# Patient Record
Sex: Male | Born: 1940 | Race: Black or African American | Hispanic: No | Marital: Married | Smoking: Former smoker
Health system: Southern US, Community
[De-identification: ages and names within clinical notes are randomized; demographics above are authoritative.]

## PROBLEM LIST (undated history)

## (undated) DIAGNOSIS — E7800 Pure hypercholesterolemia, unspecified: Secondary | ICD-10-CM

## (undated) DIAGNOSIS — M1990 Unspecified osteoarthritis, unspecified site: Secondary | ICD-10-CM

## (undated) DIAGNOSIS — F319 Bipolar disorder, unspecified: Secondary | ICD-10-CM

## (undated) DIAGNOSIS — F0390 Unspecified dementia without behavioral disturbance: Secondary | ICD-10-CM

## (undated) DIAGNOSIS — I10 Essential (primary) hypertension: Secondary | ICD-10-CM

## (undated) HISTORY — DX: Pure hypercholesterolemia, unspecified: E78.00

## (undated) HISTORY — PX: HERNIA REPAIR: SHX51

## (undated) HISTORY — PX: SHOULDER SURGERY: SHX246

---

## 2000-10-06 ENCOUNTER — Encounter: Payer: Self-pay | Admitting: Otolaryngology

## 2000-10-06 ENCOUNTER — Ambulatory Visit (HOSPITAL_COMMUNITY): Admission: RE | Admit: 2000-10-06 | Discharge: 2000-10-06 | Payer: Self-pay | Admitting: Otolaryngology

## 2000-10-19 ENCOUNTER — Ambulatory Visit (HOSPITAL_COMMUNITY): Admission: RE | Admit: 2000-10-19 | Discharge: 2000-10-19 | Payer: Self-pay | Admitting: Internal Medicine

## 2000-10-23 ENCOUNTER — Encounter: Payer: Self-pay | Admitting: Internal Medicine

## 2000-10-23 ENCOUNTER — Ambulatory Visit (HOSPITAL_COMMUNITY): Admission: RE | Admit: 2000-10-23 | Discharge: 2000-10-23 | Payer: Self-pay | Admitting: Internal Medicine

## 2006-12-09 ENCOUNTER — Ambulatory Visit: Payer: Self-pay | Admitting: Orthopedic Surgery

## 2007-06-17 ENCOUNTER — Emergency Department (HOSPITAL_COMMUNITY): Admission: EM | Admit: 2007-06-17 | Discharge: 2007-06-17 | Payer: Self-pay | Admitting: Emergency Medicine

## 2007-06-24 ENCOUNTER — Inpatient Hospital Stay (HOSPITAL_COMMUNITY): Admission: EM | Admit: 2007-06-24 | Discharge: 2007-06-26 | Payer: Self-pay | Admitting: Orthopedic Surgery

## 2009-05-31 ENCOUNTER — Ambulatory Visit: Payer: Self-pay | Admitting: Orthopedic Surgery

## 2009-05-31 DIAGNOSIS — M169 Osteoarthritis of hip, unspecified: Secondary | ICD-10-CM

## 2009-05-31 DIAGNOSIS — M25469 Effusion, unspecified knee: Secondary | ICD-10-CM

## 2009-05-31 DIAGNOSIS — M1610 Unilateral primary osteoarthritis, unspecified hip: Secondary | ICD-10-CM | POA: Insufficient documentation

## 2009-05-31 DIAGNOSIS — M5137 Other intervertebral disc degeneration, lumbosacral region: Secondary | ICD-10-CM

## 2009-05-31 DIAGNOSIS — M1710 Unilateral primary osteoarthritis, unspecified knee: Secondary | ICD-10-CM

## 2009-12-12 ENCOUNTER — Ambulatory Visit: Payer: Self-pay | Admitting: Gastroenterology

## 2009-12-12 ENCOUNTER — Encounter: Payer: Self-pay | Admitting: Internal Medicine

## 2009-12-12 DIAGNOSIS — R1310 Dysphagia, unspecified: Secondary | ICD-10-CM | POA: Insufficient documentation

## 2009-12-12 DIAGNOSIS — D649 Anemia, unspecified: Secondary | ICD-10-CM

## 2009-12-17 ENCOUNTER — Encounter: Payer: Self-pay | Admitting: Internal Medicine

## 2009-12-28 ENCOUNTER — Ambulatory Visit: Payer: Self-pay | Admitting: Internal Medicine

## 2009-12-28 ENCOUNTER — Ambulatory Visit (HOSPITAL_COMMUNITY): Admission: RE | Admit: 2009-12-28 | Discharge: 2009-12-28 | Payer: Self-pay | Admitting: Internal Medicine

## 2010-01-02 ENCOUNTER — Encounter: Payer: Self-pay | Admitting: Internal Medicine

## 2010-04-29 ENCOUNTER — Ambulatory Visit (HOSPITAL_COMMUNITY): Admission: RE | Admit: 2010-04-29 | Discharge: 2010-04-29 | Payer: Self-pay | Admitting: Pulmonary Disease

## 2010-07-23 NOTE — Letter (Signed)
Summary: Patient Notice, Colon Biopsy Results  [facility]  [address]   [place], [place] [address]   Phone: [phone]  Fax: [phone]       January 02, 2010   Malik Nicholson [address] [place], [place]  [address] 03-Jan-1941    Dear Mr. Mehrer,  I am pleased to inform you that the biopsies taken during your recent colonoscopy did not show any evidence of cancer upon pathologic examination. Biopsies of your stomach revealed only mild inflammation.  Additional information/recommendations:  Continue with the treatment plan as outlined on the day of your exam.  You should have a repeat colonoscopy examination  in 5 years.  Please call us if you are having persistent problems or have questions about your condition that have not been fully answered at this time.  Sincerely,    R. Roetta Sessions MD, FACP [facility] Ph: [phone]    Fax: [phone]   Appended Document: Patient Notice, Colon Biopsy Results Letter mailed to pt.  Appended Document: Patient Notice, Colon Biopsy Results reminder in computer

## 2010-07-23 NOTE — Assessment & Plan Note (Signed)
Summary: DYSPHAGIA/SS   Visit Type:  Initial Consult Referring Provider:  Juanetta Gosling Primary Care Provider:  Juanetta Gosling  Chief Complaint:  dysphagia.  History of Present Illness: Malik Nicholson is a pleasant 70 y/o male, patient of Dr. Juanetta Gosling, who presents for further evaluation of six week h/o difficulty swallowing. Especially with meats, carrots, bread. No problems with liquids. No heartburn, indigestion. Sometimes small particles of food come up. No N/V, abd pain, constipation, diarrhea, melena, brbpr, weight loss. He states last TCS was in 2002 and normal.   Patient recently started on ibuprofen 800mg  three times a day. He is not sure why, states it is for slightly low Hgb.   Labs 11/12/09; Hgb 12.6/Hct 39.6, ferritin 589, TSH 1.137, LFTs normal.  Stool hemoccult negative X 3.  Patient is interested in colonoscopy at this time. Last TCS 4/02 by Dr. Jena Gauss, poor prep on right colon.   Current Medications (verified): 1)  Lisinopril 20 Mg Tabs (Lisinopril) .... Take 1 Tablet By Mouth Once A Day 2)  Lipitor 20 Mg Tabs (Atorvastatin Calcium) .... Take 1/2 Tablet Daily 3)  Ibuprofen 800 Mg Tabs (Ibuprofen) .... Take 1 Tablet By Mouth Three Times A Day  Allergies (verified): No Known Drug Allergies  Past History:  Past Medical History: Hypertension Hypercholesterolemia Overactive bladder TCS 09/2000, Dr. Jena Gauss, poor prep right colon. BE negative.   Past Surgical History: Hernia Left shoulder hemiarthroplasty for fracture, 1/09  Family History: No FH of CRC or polyps. No liver disease.  Social History: Patient is married. Retired. Quit tob in 1999. Etoh, one to two 12 ounce daily.  Very little caffeine use.  Review of Systems General:  Denies fever, chills, sweats, anorexia, fatigue, weakness, and weight loss. Eyes:  Denies vision loss. ENT:  Complains of nasal congestion and difficulty swallowing; denies sore throat and hoarseness. CV:  Denies chest pains, angina, palpitations,  dyspnea on exertion, and peripheral edema. Resp:  Denies dyspnea at rest, dyspnea with exercise, cough, sputum, and wheezing. GI:  See HPI. GU:  Denies urinary burning and blood in urine. MS:  Denies joint pain / LOM. Derm:  Denies rash and itching. Neuro:  Denies weakness, frequent headaches, memory loss, and confusion. Psych:  Denies depression and anxiety. Endo:  Denies unusual weight change. Heme:  Denies bruising and bleeding. Allergy:  Denies hives and rash.  Vital Signs:  Patient profile:   70 year old male Height:      71 inches Weight:      216 pounds BMI:     30.23 Temp:     98.8 degrees F oral Pulse rate:   76 / minute BP sitting:   140 / 84  (left arm) Cuff size:   large  Vitals Entered By: Cloria Spring LPN (December 12, 2009 1:25 PM)  Physical Exam  General:  Well developed, well nourished, no acute distress. Head:  Normocephalic and atraumatic. Eyes:  Conjunctivae pink, no scleral icterus.  Mouth:  Oropharyngeal mucosa moist, pink.  No lesions, erythema or exudate.    Neck:  Supple; no masses or thyromegaly. Lungs:  Clear throughout to auscultation. Heart:  Regular rate and rhythm; no murmurs, rubs,  or bruits. Abdomen:  Bowel sounds normal.  Abdomen is soft, nontender, nondistended.  No rebound or guarding.  No hepatosplenomegaly, masses or hernias.  No abdominal bruits.  Extremities:  No clubbing, cyanosis, edema or deformities noted. Neurologic:  Alert and  oriented x4;  grossly normal neurologically. Skin:  Intact without significant lesions or rashes. Cervical Nodes:  No significant cervical adenopathy. Psych:  Alert and cooperative. Normal mood and affect.  Impression & Recommendations:  Problem # 1:  DYSPHAGIA UNSPECIFIED (ICD-787.20)  Six week h/o dysphagia to solid foods. Denies heartburn/indigestion. DDx includes stricture, ring, malignancy, diverticulum. Recommend further evaluation. EGD/ED to be performed in near future.  Risks, alternatives,  benefits including but not limited to risk of reaction to medications, bleeding, infection, and perforation addressed.  Patient voiced understanding and verbal consent obtained.   Orders: Consultation Level III ([address])  Problem # 2:  ANEMIA, NORMOCYTIC (ICD-285.9)  Very mild normocytic anemia. Patient's last TCS 9 years ago and poor prep on right colon. Proceed with TCS now with modified prep. Colonoscopy to be performed in near future.  Risks, alternatives, and benefits including but not limited to the risk of reaction to medication, bleeding, infection, and perforation were addressed.  Patient voiced understanding and provided verbal consent.   Orders: Consultation Level III ([address]) I would like to thank Dr. Juanetta Gosling for allowing [place] to take part in the care of this nice patient.

## 2010-07-23 NOTE — Op Note (Signed)
Summary: Operative Report  Operative Report   Imported By: Rosine Beat 12/17/2009 12:05:43  _____________________________________________________________________  External Attachment:    Type:   Image     Comment:   External Document

## 2010-07-23 NOTE — Letter (Signed)
Summary: referral from dr. Kari Baars  referral from dr. Kari Baars   Imported By: Rosine Beat 12/17/2009 12:09:29  _____________________________________________________________________  External Attachment:    Type:   Image     Comment:   External Document

## 2010-07-23 NOTE — Letter (Signed)
Summary: Internal Other /procedure instructions  Internal Other /procedure instructions   Imported By: Cloria Spring LPN 25/95/6387 56:43:32  _____________________________________________________________________  External Attachment:    Type:   Image     Comment:   External Document

## 2010-08-23 ENCOUNTER — Encounter: Payer: Self-pay | Admitting: Urgent Care

## 2010-08-26 ENCOUNTER — Encounter (INDEPENDENT_AMBULATORY_CARE_PROVIDER_SITE_OTHER): Payer: Self-pay | Admitting: *Deleted

## 2010-09-03 NOTE — Medication Information (Signed)
Summary: PROTONIX 40MG   PROTONIX 40MG    Imported By: Rexene Alberts 08/23/2010 10:34:24  _____________________________________________________________________  External Attachment:    Type:   Image     Comment:   External Document  Appended Document: PROTONIX 40MG     Prescriptions: PROTONIX 40 MG TBEC (PANTOPRAZOLE SODIUM) 1 by mouth daly for acid reflux  #31 x 11   Entered and Authorized by:   Joselyn Arrow FNP-BC   Signed by:   Joselyn Arrow FNP-BC on 08/26/2010   Method used:   Printed then faxed to ...       [facility] [address] (retail)       [address]       [place], [place]  [address]  [place]       Ph: [phone]       Fax: [phone]   RxID:   [id number]  Please fax to pharm   Appended Document: PROTONIX 40MG  Faxed.

## 2010-09-03 NOTE — Medication Information (Signed)
Summary: RX Folder  RX Folder   Imported By: Peggyann Shoals 08/26/2010 12:48:24  _____________________________________________________________________  External Attachment:    Type:   Image     Comment:   External Document

## 2010-11-05 NOTE — Op Note (Signed)
Malik Nicholson, Malik Nicholson              ACCOUNT NO.:  [id number]   MEDICAL RECORD NO.:  [id number]          PATIENT TYPE:  INP   LOCATION:  5128                         FACILITY:  [facility]   PHYSICIAN:  Malik Nicholson. Malik Nicholson, M.D. DATE OF BIRTH:  03/26/1941   DATE OF PROCEDURE:  06/24/2007  DATE OF DISCHARGE:                               OPERATIVE REPORT   PREOPERATIVE DIAGNOSIS:  Displaced left proximal humerus fracture.   POSTOPERATIVE DIAGNOSIS:  Displaced left proximal humerus fracture.   PROCEDURE PERFORMED:  Left shoulder hemiarthroplasty using DePuy [employer]  FX system.   ATTENDING SURGEON:  Malik Nicholson. Malik Nicholson, M.D.   ASSISTANT:  Standley Dakins, PA-C.   ANESTHESIA:  General anesthesia was used.   ESTIMATED BLOOD LOSS:  200 mL.   FLUID REPLACEMENT:  2000 mL crystalloid.   COUNTS:  Instrument count was correct.   COMPLICATIONS:  None.   ANTIBIOTICS:  No antibiotics given.   INDICATIONS:  The patient is a 70 year old male who suffered a displaced  proximal humerus fracture around Christmas time.  The patient presented  initially to [facility] Emergency Department, was managed in a sling  initially and was eventually referred down to [facility] on 06/22/08.  The patient presented to orthopedics as an outpatient with a displaced  proximal humerus fracture with a dislocated humeral head component and a  significant comminution in the tuberosities.  The patient now presents  for operative treatment to restore proximal humeral anatomy on the left  shoulder and to restore function and eliminate pain.  Informed consent  was obtained.   DESCRIPTION OF PROCEDURE:  After an adequate level of anesthesia  achieved, the patient was positioned in the modified beach chair  position.  All neurovascular structures were padded appropriately.  The  left shoulder was sterilely prepped and draped in the usual manner.  We  entered the shoulder through a deltopectoral incision.  Dissection was  carried  sharply down through subcutaneous tissues, deltopectoral  interval identified, cephalic vein taken laterally to the deltoid.  We  went ahead and identified the fractured humerus.  Copious amounts of  hematoma were removed.  We thoroughly irrigated that area, identified  the subscapularis and lesser tuberosity, and went ahead and cleaned off  all the organizing hematoma present around that area, freed up the  subscapularis from the coracoid process and also from the capsule, such  that it was fully mobilized and we debrided the lesser tuberosity down  to a manageable size that it would fit nicely back into the  reconstruction of the tuberosities as a part of the humeral prosthesis.   We next placed two #2 FiberWire sutures in mattress fashion with the  suture strands coming out over the top of the bone to the lesser  tuberosity.  This was one nice fragment for the lesser.  The fracture  humeral head was found posteriorly with the cancellous bone portion  facing into the deltoid.  There was actually some damage to the deltoid  on the undersurface of it.  The bone seemed to be digging into that  deltoid.  In addition, the humeral shaft  was digging in there as well.  We did remove hat bone and used to morcellized that for grafting at the  end.  The greater tuberosity was not one single piece of bone but was  probably 20 pieces of small bone and unfortunately not a good large  piece of bone for fixation.  We placed #2 FiberWire suture in modified W  stitch, a total of 3 of them with 6 strands of suture which will be  coming out over the top of what is left of the bony portion of the  greater tuberosity.  We did mobilize this off the posterior glenoid and  scapular neck and also off the underside of the deltoid.  We released  the biceps tendon performing its tenotomy and then we marked the biceps  groove, went ahead and prepared the humeral shaft by reaming it up to  size 12 and then placing a  12 stem with the anterior fin adjacent to the  biceps groove.  We also had a 52 head x 18 and we placed that trial on  the stem and adjusted the height appropriately.  We felt that it would  be four laser marks at the biceps groove.   At this point, we placed three drill holes in the anterior humeral shaft  to place a total of two #2 FiberWire sutures in a mattress fashion with  four strands of suture coming out anteriorly to provide shaft to  tuberosity fixation.  We then vacuum mixed the DePuy #1 cement, went  ahead and let it get into a doughy state and then hand packed the cement  down the humeral shaft and then placed the stem in down to the fourth  laser mark with the anterior fin adjacent to the biceps groove.  Once it  fully hardened, we had around the [place] stitch through the medial fin of  the prosthesis.  That stitch came out medial to the lesser tuberosity  and lateral to again what was left of the greater tuberosity.  We then  bone graft it extensively, the anterior humerus, the exposed portion of  the prosthesis, and then brought the tuberosity down on top of that bone  and sutured into themselves with the mattress sutures.   Next, we tied our around the world stitch.  We also took our sutures  from the shaft and brought those up and gained excellent purchase in the  tendon of the rotator cuff and at the subscapularis and gaining  excellent relative stability to the tuberosities and rotator cuff.  We  are going to have to go quite a bit slower because we did not have a  solid greater tuberosity piece, but we did have what I felt to be a very  minimal relative motion in the reconstruction as I rotated the shoulder  in and out.  We thoroughly irrigated and then closed the deltopectoral  interval with 0 Vicryl suture followed by 2-0 Vicryl subcutaneous  closure and 4-0 Monocryl for skin.  Steri-Strips were applied followed  by a sterile dressing.  The patient tolerated surgery  well.      Malik Nicholson. Malik Nicholson, M.D.  Electronically Signed     SRN/MEDQ  D:  06/24/2007  T:  06/24/2007  Job:  [id number]

## 2010-11-05 NOTE — Discharge Summary (Signed)
Malik Nicholson, Malik Nicholson              ACCOUNT NO.:  [id number]   MEDICAL RECORD NO.:  [id number]          PATIENT TYPE:  INP   LOCATION:  5128                         FACILITY:  [facility]   PHYSICIAN:  Almedia Balls. Ranell Patrick, M.D. DATE OF BIRTH:  Jul 07, 1940   DATE OF ADMISSION:  06/24/2007  DATE OF DISCHARGE:  06/26/2007                               DISCHARGE SUMMARY   ADMISSION DIAGNOSIS:  Left proximal humerus fracture.   DISCHARGE DIAGNOSIS:  Left proximal humerus fracture.   BRIEF HISTORY:  The patient is a 70 year old male who states he fell off  a ladder on the 24th/25th of December.  The patient was seen in our  office on the 30th of December diagnosed with left proximal humerus  fracture.  The patient elected to have an ORIF versus hemiarthroplasty  of the left shoulder.   PROCEDURE:  The patient had a left shoulder hemiarthroplasty by Dr.  Malon Kindle using a DePuy global effect system on June 24, 2007.  Assistant was [employer]. PA-C.  General anesthesia was used.  Estimated blood loss 200 mL.  Fluid replacement 2000 mL.  No  complications.   HOSPITAL COURSE:  The patient admitted on June 24, 2007 where he  underwear the above-stated procedure, which he tolerated well.  After  adequate time in the post anesthesia care unit, was transferred up to  5000.  Postop day #1, the patient states he had some moderate pain right  after surgery but currently is resting comfortably.  Has only some  minimal discomfort to the left arm.  The incision showed some mild  bleeding but was under control.  Neurovascularly he is intact distally  and capillary fill was less than 2 seconds.  Postop day 2, he had a  dressing change.  No sign of cellulitis, erythema or infection.  Kept in  a sling.  The patient was also placed on an Ativan protocol for EtOH  withdrawal.  The patient was able to perform some general physical  therapy with that left arm. Neurovascularly he was intact distally.   DISCHARGE/PLAN:  The patient being discharged home on June 26, 2007.  The patient has no known drug allergies.   DISCHARGE MEDICATIONS:  1. Percocet 5 mg 1-2 tablets q. 4-6 hours p.r.n. pain.  2. Robaxin 500 mg p.o. q.6 hours p.r.n.  3. Also Lipitor  4. And Prinivil unknown dosages.   DIET:  Is regular.   CONDITION:  Is stable.   FOLLOW-UP:  Patient will follow back up back up with Dr. Malon Kindle  in 2 weeks.      Thomas B. Durwin Nora, P.A.      Almedia Balls. Ranell Patrick, M.D.  Electronically Signed    TBD/MEDQ  D:  06/25/2007  T:  06/25/2007  Job:  [id number]

## 2010-11-05 NOTE — H&P (Signed)
NAMEJALANI, Malik Nicholson NO.:  [id number]   MEDICAL RECORD NO.:  [id number]          PATIENT TYPE:  EMS   LOCATION:  ED                            FACILITY:  [facility]   PHYSICIAN:  Almedia Balls. Ranell Patrick, M.D. DATE OF BIRTH:  August 13, 1940   DATE OF ADMISSION:  06/17/2007  DATE OF DISCHARGE:  06/17/2007                              HISTORY & PHYSICAL   CHIEF COMPLAINT:  Left shoulder pain.   HISTORY OF PRESENT ILLNESS:  Patient is a 70 year old healthy male who  fell off a ladder on June 17, 2007, injuring his left shoulder.  Patient was seen at the [facility] office and referred to  Dr. Ranell Patrick for a proximal humerus fracture.  Patient denied any other  injuries, loss of consciousness, or injuries to the right upper  extremity, bilateral lower extremities, or pelvis.   MEDICAL HISTORY:  1. Hypertension.  2. Hyperlipidemia.   FAMILY HISTORY:  Coronary artery disease.   CURRENT MEDICATIONS:  Lipitor, Prinvil, and unknown pain medicine.   DRUG ALLERGIES:  None.   SOCIAL HISTORY:  Patient of Dr. Juanetta Gosling in [place].  Does not smoke.  Uses occasional alcohol.  Is left-handed and is retired.   PHYSICAL EXAMINATION:  Patient is a 70 year old healthy-appearing male.  No acute distress.  Pleasant mood and affect.  Alert and oriented x3.  Height is 5 foot 10.  Weight is 215.  Respirations are 18 here in the  office today.  Cervical spine shows no tenderness to palpation in the paraspinal  muscles or spinous process.  He has full range of motion with no  difficulty.  Cranial nerves II-XII are grossly intact.  Examination of  the left shoulder shows moderate edema.  It is extending all the way  from his left shoulder down to his left hand.  Range of motion is  limited due to known fracture.  Neurovascularly, he is intact otherwise.  Examination of the right upper extremity, bilateral lower extremities  show full range of motion.  Sensation is grossly intact.  No  gross  deformity.  Patient does have active breath sounds and is breathing  normally.   X-ray shows a severely comminuted and displaced left proximal humerus  fracture.   IMPRESSION:  Severely comminuted displaced left proximal humerus  fracture.   PLAN:  Left shoulder hemiarthroplasty by Dr. Malon Kindle.      Thomas B. Durwin Nora, P.A.      Almedia Balls. Ranell Patrick, M.D.  Electronically Signed    TBD/MEDQ  D:  06/22/2007  T:  06/22/2007  Job:  [id number]

## 2011-03-13 LAB — BASIC METABOLIC PANEL
BUN: 11
CO2: 26
CO2: 29
Chloride: 96
Chloride: 96
Creatinine, Ser: 0.82
Creatinine, Ser: 0.97
Glucose, Bld: 122 — ABNORMAL HIGH
Potassium: 4.6
Sodium: 130 — ABNORMAL LOW

## 2011-03-13 LAB — CBC
HCT: 28.6 — ABNORMAL LOW
HCT: 29.7 — ABNORMAL LOW
Hemoglobin: 10.2 — ABNORMAL LOW
Hemoglobin: 11.4 — ABNORMAL LOW
MCV: 89.2
Platelets: 339
Platelets: 385
RBC: 3.33 — ABNORMAL LOW
RBC: 3.72 — ABNORMAL LOW
RDW: 14.7
RDW: 14.7
WBC: 11.7 — ABNORMAL HIGH
WBC: 9.1

## 2011-03-13 LAB — APTT: aPTT: 32

## 2011-03-13 LAB — PROTIME-INR: INR: 1

## 2011-03-13 LAB — DIFFERENTIAL
Basophils Absolute: 0
Basophils Relative: 0
Lymphocytes Relative: 14
Monocytes Absolute: 1.1 — ABNORMAL HIGH
Neutro Abs: 7.5
Neutrophils Relative %: 73

## 2011-07-30 ENCOUNTER — Ambulatory Visit (INDEPENDENT_AMBULATORY_CARE_PROVIDER_SITE_OTHER): Payer: Medicare Other | Admitting: Orthopedic Surgery

## 2011-07-30 ENCOUNTER — Encounter: Payer: Self-pay | Admitting: Orthopedic Surgery

## 2011-07-30 VITALS — BP 140/80 | Ht 73.0 in | Wt 198.0 lb

## 2011-07-30 DIAGNOSIS — M169 Osteoarthritis of hip, unspecified: Secondary | ICD-10-CM | POA: Insufficient documentation

## 2011-07-30 DIAGNOSIS — IMO0002 Reserved for concepts with insufficient information to code with codable children: Secondary | ICD-10-CM | POA: Insufficient documentation

## 2011-07-30 DIAGNOSIS — G5790 Unspecified mononeuropathy of unspecified lower limb: Secondary | ICD-10-CM

## 2011-07-30 DIAGNOSIS — M5137 Other intervertebral disc degeneration, lumbosacral region: Secondary | ICD-10-CM

## 2011-07-30 MED ORDER — GABAPENTIN 100 MG PO CAPS: 100.0000 mg | ORAL_CAPSULE | Freq: Three times a day (TID) | ORAL | Status: DC

## 2011-07-30 NOTE — Progress Notes (Signed)
X-ray reports  [facility] pelvis  LEFT hip pain  There is degenerative arthritis in both hips LEFT greater than RIGHT there are some mild osteophytes and joint space narrowing primarily centrally  Impression osteoarthritis LEFT greater than RIGHT both hips mild    X-ray lumbar spine  3 views  Radicular-like symptoms LEFT leg  Degenerative scoliosis is seen in the lumbar spine with multiple osteophytes consistent with spinal stenosis like changes  Impression spinal stenosis with degenerative disc disease and scoliosis

## 2011-07-30 NOTE — Progress Notes (Signed)
  Subjective:    Malik Nicholson is a 71 y.o. male who presents for evaluation of low back pain. The patient has had previous osteoarthritis of lumbar spine. Symptoms have been present for 3 years and are gradually worsening.  Onset was related to / precipitated by no known injury. The pain is located in the left sacroiliac area, left gluteal area or radiating to left leg(s) and radiates to the left lower leg. The pain is described as throbbing and occurs at bedtime. He rates his pain as a 6 on a scale of 0-10. Symptoms are exacerbated by nothing in particular. Symptoms are improved by nothing. He has also tried Neurontin and steroid dose pack back in December of 2010 with good result which provided no symptom relief. He has tingling in the left leg associated with the back pain. The patient has no "red flag" history indicative of complicated back pain.  The following portions of the patient's history were reviewed and updated as appropriate: allergies, current medications, past family history, past medical history, past social history, past surgical history and problem list.  Review of Systems Respiratory: positive for wheezing Hematologic/lymphatic: positive for seasonal ALLERGIES    Objective:   Inspection and palpation: scoliosis noted lumbar, spinal tenderness noted LEFT, paraspinal tenderness noted LEFT, sacroiliac tenderness noted LEFT. Muscle tone and ROM exam: muscle tone normal without spasm, full range of motion without pain. Straight leg raise: positive at 40 degrees on the left. Neurological: normal DTRs, muscle strength and reflexes. The range of motion in his hips is notable for 115 of hip flexion bilaterally with obligatory external rotation related to the hip arthritis.  There is decreased internal rotation bilaterally with painful internal rotation LEFT hip only. 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  Degenerative arthritis is seen in both LEFT and RIGHT hip LEFT worse than RIGHT and there is degenerative scoliotic disease and spinal stenosis lumbar spine.   Plan:    I would like to have him go for lidocaine steroid injection test to see how much pain relief he would get from that which would help [place] plan whether or not he needs hip replacement vs. continued followup and treatment related to his lower back disease which would include evaluation with MRI after some physical therapy.

## 2011-07-30 NOTE — Patient Instructions (Signed)
You will be scheduled for a lidocaine injection test

## 2011-08-01 ENCOUNTER — Other Ambulatory Visit: Payer: Self-pay | Admitting: *Deleted

## 2011-08-01 DIAGNOSIS — M25552 Pain in left hip: Secondary | ICD-10-CM

## 2011-08-04 ENCOUNTER — Telehealth: Payer: Self-pay | Admitting: Radiology

## 2011-08-04 NOTE — Telephone Encounter (Signed)
I called to give the patient his appointment at [facility] on 08-07-11 at 12:45 for his left hip arthrogram. Patient will follow up back here at the office for his results.

## 2011-08-07 ENCOUNTER — Ambulatory Visit (HOSPITAL_COMMUNITY)
Admission: RE | Admit: 2011-08-07 | Discharge: 2011-08-07 | Disposition: A | Discharge status: Home / Self Care | Payer: Medicare Other | Source: Ambulatory Visit | Attending: Orthopedic Surgery | Admitting: Orthopedic Surgery

## 2011-08-07 DIAGNOSIS — M25559 Pain in unspecified hip: Secondary | ICD-10-CM | POA: Insufficient documentation

## 2011-08-07 DIAGNOSIS — M25552 Pain in left hip: Secondary | ICD-10-CM

## 2011-08-07 MED ORDER — METHYLPREDNISOLONE ACETATE 80 MG/ML IJ SUSP
120.0000 mg | Freq: Once | INTRAMUSCULAR | Status: AC
  Administered 2011-08-07: 120 mg via INTRA_ARTICULAR
  Filled 2011-08-07: qty 1.5

## 2011-08-07 MED ORDER — IOHEXOL 300 MG/ML  SOLN
10.0000 mL | Freq: Once | INTRAMUSCULAR | Status: AC | PRN
  Administered 2011-08-07: 10 mL via INTRA_ARTICULAR

## 2011-08-07 MED ORDER — BUPIVACAINE HCL 0.5 % IJ SOLN
10.0000 mL | Freq: Once | INTRAMUSCULAR | Status: DC
  Administered 2011-08-07: 10 mL via INTRA_ARTICULAR
  Filled 2011-08-07: qty 10

## 2011-08-07 NOTE — Procedures (Signed)
PreOperative Dx: LEFT hip pain Postoperative Dx: LEFT hip pain Procedure:   Fluoro guided therapeutic LEFT hip injection Radiologist:  Tyron Russell Anesthesia:  3 ml of 2% lidocaine Specimen:  None EBL:   None Complications: None Comment:    LEFT hip injected with 120 mg Depo-Medrol and 8.5 ml of 0.5% bupivocaine

## 2011-08-07 NOTE — Progress Notes (Signed)
Sensorcaine 0.5%       8.68mL injected Depomedrol 120mg      1.23mL injected

## 2011-08-20 ENCOUNTER — Ambulatory Visit (INDEPENDENT_AMBULATORY_CARE_PROVIDER_SITE_OTHER): Payer: Medicare Other | Admitting: Orthopedic Surgery

## 2011-08-20 ENCOUNTER — Encounter: Payer: Self-pay | Admitting: Orthopedic Surgery

## 2011-08-20 VITALS — BP 126/66 | Ht 73.0 in | Wt 198.0 lb

## 2011-08-20 DIAGNOSIS — M169 Osteoarthritis of hip, unspecified: Secondary | ICD-10-CM

## 2011-08-20 DIAGNOSIS — M5137 Other intervertebral disc degeneration, lumbosacral region: Secondary | ICD-10-CM

## 2011-08-20 DIAGNOSIS — M51379 Other intervertebral disc degeneration, lumbosacral region without mention of lumbar back pain or lower extremity pain: Secondary | ICD-10-CM | POA: Insufficient documentation

## 2011-08-20 NOTE — Progress Notes (Signed)
Patient ID: Malik Nicholson, male   DOB: [birth year], 71 y.o.   MRN: [id number] Chief Complaint  Patient presents with  . Follow-up    3 week recheck Left hip following lidocaine injection     He has a degenerative disc condition in his lower back and has osteoarthritis of his LEFT hip. His symptoms were mixed, so we sent him for lidocaine injection test in his hip and knee basically improved about 85%. He no longer has to take the Neurontin that we put him on as well. He does have some residual pain, up near his groin. He says he can live with at this time.  Continue Neurontin as needed. Come back in July for x-rays.

## 2011-08-20 NOTE — Patient Instructions (Signed)
Introductions were given to continue medication as needed. Call us sooner than July if symptoms worsen.

## 2011-11-25 ENCOUNTER — Ambulatory Visit (INDEPENDENT_AMBULATORY_CARE_PROVIDER_SITE_OTHER): Payer: Medicare Other | Admitting: Orthopedic Surgery

## 2011-11-25 ENCOUNTER — Encounter: Payer: Self-pay | Admitting: Orthopedic Surgery

## 2011-11-25 ENCOUNTER — Ambulatory Visit (INDEPENDENT_AMBULATORY_CARE_PROVIDER_SITE_OTHER): Payer: Medicare Other

## 2011-11-25 VITALS — BP 104/60 | Ht 71.0 in | Wt 203.0 lb

## 2011-11-25 DIAGNOSIS — M1710 Unilateral primary osteoarthritis, unspecified knee: Secondary | ICD-10-CM

## 2011-11-25 DIAGNOSIS — M25569 Pain in unspecified knee: Secondary | ICD-10-CM

## 2011-11-25 NOTE — Progress Notes (Signed)
Subjective:     Patient ID: Malik Nicholson, male   DOB: 02/18/1941, 71 y.o.   MRN: [id number] Chief Complaint  Patient presents with  . 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 The pain has become fairly severe and is interfering with his activities of daily living and overall function  Past Medical History  Diagnosis Date  . High blood pressure     Past Surgical History  Procedure Date  . Shoulder surgery   . Hernia repair    family history includes Cancer in an unspecified family member; Diabetes in an unspecified family member; and Lung disease in an unspecified family member.   Review of Systems As noted in history    Objective:   Physical Exam BP 104/60  Ht 5\' 11"  (1.803 m)  Wt 203 lb (92.08 kg)  BMI 28.31 kg/m2 General appearance is normal Orientation x3 normal Mood and affect normal Abnormal gait pattern favoring his left leg inability to bear weight and inability to fully extend the knee Evaluation of left knee shows the knee is in valgus alignment with painless range of motion past 90 up to 130, he is tender over the medial joint line and medial femoral condyle. His collateral ligaments are stable his condition ligaments are stable. There is normal muscle tone in the knee the skin is normal without rash. There is no edema in the limb the pulse and temperature  are normal. There is no lymphadenopathy in the extremity. Gross sensation is normal. Pathologic reflexes negative. Coordination normal balance hard to assess secondary inability to weight-bear      Assessment:    The x-ray evaluation of his knee show diffuse and equal joint space narrowing medial and lateral compartment. The patella centered. Osteoarthritis with no effusion.  Difficult to assess atraumatic onset of knee pain other than exacerbation of arthritis. He is also considering hip joint replacement surgery however, we need to get his knee pain under control prior to that. He also has spinal stenosis symptoms as he is using a grocery cart to make it through the grocery store or he will become severely fatigued and cannot walk.  As noted above we stated that he was given an intra-articular injection of cortisone which helped his left hip pain indicating hip as the primary source of pain however his elevation of his spinal stenosis symptoms causes and the question as well. He's also having knee symptoms x-ray showing end-stage arthritis in the knee so this case is quite complicated. We discussed this.  We gave him an injection in the left knee    Plan:     Inject left knee  Come back in 2 weeks reassess left knee discussed left hip surgery and discuss spinal stenosis symptoms.  Knee  Injection Procedure Note  Pre-operative Diagnosis: left knee oa  Post-operative Diagnosis: same  Indications: pain  Anesthesia:  ethyl chloride   Procedure Details   Verbal consent was obtained for the procedure. Time out was completed.The joint was prepped with alcohol, followed by  Ethyl chloride spray and A 20 gauge needle was inserted into the knee via lateral approach; 4ml 1% lidocaine and 1 ml of depomedrol  was then injected into the joint . The needle was removed and the area cleansed and dressed.  Complications:  None; patient tolerated the procedure well.

## 2011-11-25 NOTE — Patient Instructions (Signed)
You have received a steroid shot. 15% of patients experience increased pain at the injection site with in the next 24 hours. This is best treated with ice and tylenol extra strength 2 tabs every 8 hours. If you are still having pain please call the office.   Rest next 2 weeks limit your activity

## 2011-12-10 ENCOUNTER — Encounter: Payer: Self-pay | Admitting: Orthopedic Surgery

## 2011-12-10 ENCOUNTER — Ambulatory Visit (INDEPENDENT_AMBULATORY_CARE_PROVIDER_SITE_OTHER): Payer: Medicare Other | Admitting: Orthopedic Surgery

## 2011-12-10 VITALS — BP 130/78 | Ht 71.0 in | Wt 201.0 lb

## 2011-12-10 DIAGNOSIS — M179 Osteoarthritis of knee, unspecified: Secondary | ICD-10-CM

## 2011-12-10 DIAGNOSIS — M169 Osteoarthritis of hip, unspecified: Secondary | ICD-10-CM

## 2011-12-10 DIAGNOSIS — M1610 Unilateral primary osteoarthritis, unspecified hip: Secondary | ICD-10-CM

## 2011-12-10 DIAGNOSIS — M4800 Spinal stenosis, site unspecified: Secondary | ICD-10-CM

## 2011-12-10 DIAGNOSIS — IMO0002 Reserved for concepts with insufficient information to code with codable children: Secondary | ICD-10-CM

## 2011-12-10 DIAGNOSIS — M1710 Unilateral primary osteoarthritis, unspecified knee: Secondary | ICD-10-CM

## 2011-12-10 NOTE — Patient Instructions (Addendum)
We will schedule your MRI and call you with the date and time

## 2011-12-10 NOTE — Progress Notes (Signed)
Patient ID: Malik Nicholson, male   DOB: 1941-03-06, 71 y.o.   MRN: [id number] Chief Complaint  Patient presents with  . Follow-up    2 week follow up left knee and left hip    BP 130/78  Ht 5\' 11"  (1.803 m)  Wt 91.173 kg (201 lb)  BMI 28.03 kg/m2  Problem #1 LEFT knee pain status post injection with improvement.  Problem #2 LEFT hip arthritis with continued pain we'll need hip replacement.  Problem #3 pain over the LEFT iliac crest, history of spinal stenosis on x-ray. Recommend MRI to evaluate LEFT lower extremity radicular pain.  Previous treatment includes activity modification, physical therapy, oral anti-inflammatories, pain medication. The treatment. Lasted more than 6 weeks.  Review of Systems  Musculoskeletal: Positive for back pain and joint pain.  All other systems reviewed and are negative.   Right Hip Exam  Right hip exam is normal.   Range of Motion  The patient has normal right hip ROM.  Muscle Strength  The patient has normal right hip strength.  Tests  FABER: negative   Left Hip Exam   Tenderness  The patient is experiencing tenderness in the lateral.  Range of Motion  Flexion:  90 abnormal  Internal Rotation: 5 abnormal  External Rotation:  20 abnormal  Abduction: 20  Adduction: 15   Muscle Strength  The patient has normal left hip strength.   Other  Erythema: absent Scars: absent Sensation: normal Pulse: present  Comments:  He has a flexed spinal posture and a limp  The lumbar soine is tender as well at L5-S1       Schedule for MRI and then we will schedule her for hip replacement surgery

## 2011-12-18 ENCOUNTER — Ambulatory Visit (HOSPITAL_COMMUNITY)
Admission: RE | Admit: 2011-12-18 | Discharge: 2011-12-18 | Disposition: A | Discharge status: Home / Self Care | Payer: Medicare Other | Source: Ambulatory Visit | Attending: Orthopedic Surgery | Admitting: Orthopedic Surgery

## 2011-12-18 DIAGNOSIS — M545 Low back pain, unspecified: Secondary | ICD-10-CM | POA: Insufficient documentation

## 2011-12-18 DIAGNOSIS — M5126 Other intervertebral disc displacement, lumbar region: Secondary | ICD-10-CM | POA: Insufficient documentation

## 2011-12-18 DIAGNOSIS — M4800 Spinal stenosis, site unspecified: Secondary | ICD-10-CM

## 2011-12-18 DIAGNOSIS — M48061 Spinal stenosis, lumbar region without neurogenic claudication: Secondary | ICD-10-CM | POA: Insufficient documentation

## 2011-12-23 ENCOUNTER — Telehealth: Payer: Self-pay | Admitting: Radiology

## 2011-12-23 NOTE — Telephone Encounter (Signed)
Patient has Malik Nicholson, authorization # [id number] and it expires 01-29-12 for MRI of L-spine at [facility].

## 2011-12-24 ENCOUNTER — Ambulatory Visit (INDEPENDENT_AMBULATORY_CARE_PROVIDER_SITE_OTHER): Payer: Medicare Other

## 2011-12-24 ENCOUNTER — Ambulatory Visit (INDEPENDENT_AMBULATORY_CARE_PROVIDER_SITE_OTHER): Payer: Medicare Other | Admitting: Orthopedic Surgery

## 2011-12-24 ENCOUNTER — Encounter: Payer: Self-pay | Admitting: Orthopedic Surgery

## 2011-12-24 VITALS — Ht 71.0 in | Wt 201.0 lb

## 2011-12-24 DIAGNOSIS — M25559 Pain in unspecified hip: Secondary | ICD-10-CM

## 2011-12-24 DIAGNOSIS — M25552 Pain in left hip: Secondary | ICD-10-CM

## 2011-12-24 NOTE — Progress Notes (Signed)
Patient ID: Malik Nicholson, male   DOB: [birth year], 71 y.o.   MRN: [id number] Chief Complaint  Patient presents with  . Follow-up    Recheck with MRI results of L-spine.    Ht 5\' 11"  (1.803 m)  Wt 201 lb (91.173 kg)  BMI 28.03 kg/m2  This is a post imaging followup visit  Diagnosis spinal stenosis   Image MRI   Reading   *RADIOLOGY REPORT*  Clinical Data: Spinal stenosis. Back pain  MRI LUMBAR SPINE WITHOUT CONTRAST  Technique: Multiplanar and multiecho pulse sequences of the lumbar  spine were obtained without intravenous contrast.  Comparison: None.  Findings: The patient is had severe back pain and was not able to  hold still. Image quality degraded by motion. The study was  terminated prior to completion by the patient. Axial T1 images  were not obtained.  Mild anterior slip at L3-4. Negative for fracture or mass. Conus  medullaris is normal.  L1-2: Negative  L2-3: Mild disc and mild facet degeneration.  L3-4: 3 mm anterior slip. Disc bulging and bilateral facet  hypertrophy. Mild spinal stenosis.  L4-5: Diffuse bulging of the disc. Bilateral facet degeneration.  Mild right foraminal narrowing is present.  L5-S1: Mild disc degeneration and disc bulging.  IMPRESSION:  Image quality degraded by motion.  Mild anterior slip of L3-4 with mild spinal stenosis.  Disc bulging and mild right foraminal narrowing at L4-5.  Original Report Authenticated By: Camelia Phenes, M.D.       I have viewed the image and the report, I agree with the report   The plan will be TO PROCEED WITH LEFT THA   NOTE: heavy alcohol use

## 2011-12-24 NOTE — Patient Instructions (Addendum)
You have been scheduled for total hip arthroplasty please stop all blood thinners one week prior to surgery  The risks and benefits of the procedure include but are not limited to:  wound infection, postoperative bleeding, DEEP  vein thrombosis or blood clot. Pulmonary embolus which can be fatal. Dislocation of the hip. Pain in the hip. Early hip failure due to excessive wear. The average time for recovery is 3-5 months.   Total Hip Replacement In total hip replacement, the damaged hip is replaced with an artificial hip joint (prosthesis). The purpose of this surgery is to reduce pain and improve your range of motion. It is one of the most successful joint replacement surgeries. LET YOUR CAREGIVER KNOW ABOUT:    Allergies.   Medicines taken, including herbs, eyedrops, over-the-counter medicines, and creams.   Use of steroids (by mouth or creams).   Previous problems with anesthetics.   Family history of anesthetic problems.   Possibility of pregnancy, if this applies.   History of blood clots (thrombophlebitis).   History of bleeding or blood problems.   Previous surgery.   Other health problems.  BEFORE THE PROCEDURE    Do not eat or drink anything for as long as directed by your caregiver before surgery.   You should be present 60 minutes before your procedure or as directed by your caregiver.  PROCEDURE An intravenous (IV) line for giving fluids will be started. You will be given medicines and gas to make you sleep (anesthetic), or you will be given medicines through a needle in your back to make you numb from the waist down. Your surgeon will take out any damaged cartilage and bone. He or she will then put in new metal, plastic, or ceramic joint surfaces to restore alignment and function to your hip. AFTER THE PROCEDURE   After the procedure, you will be taken to the recovery area where a nurse will watch and check your progress. You may have a long, narrow tube (catheter) in  your bladder after surgery. The catheter helps you empty your bladder (pass your urine). Once you are awake, stable, and taking fluids well, you will be returned to your room. You will receive physical therapy until you are doing well and your caregiver feels it is safe for you to go home. If you do not have help at home, you may need to go to an extended care facility for 5 to 14 days after the procedure. Document Released: 09/15/2000 Document Revised: 05/29/2011 Document Reviewed: 04/11/2009 [facility] Patient Information 2012 [place], [place].   Total Hip Replacement Care After Please read the instructions outlined below. Refer to these instructions for the next few weeks. These discharge instructions provide you with general information on caring for yourself after surgery. Your caregiver may also give you more detailed instructions. If you have any problems or questions after discharge, please call your caregiver. HOME CARE INSTRUCTIONS    It will be normal to be sore for a couple weeks following surgery. See your caregiver if this seems to be getting worse rather than better.   Only take over-the-counter or prescription medicines for pain, discomfort, or fever as directed by your caregiver.   Use showers for bathing, until seen or as instructed.   Change dressings if necessary or as directed.   You may resume normal diet and activities as directed or allowed.   Avoid lifting until you are instructed otherwise.   Make an appointment to see your caregiver for stitch or staple removal  when instructed.   You may use ice on your incision for 15 to 20 minutes 3 to 4 times per day for the first two days.   Use a raised toilet seat and avoid sitting in low chairs as instructed.   Use crutches or a walker as instructed.  SEEK MEDICAL CARE IF:  There is redness, swelling, or increasing pain or tenderness in the wound.   There is pus or unusual drainage coming from the wound.   There is a  foul smell coming from the wound or dressing.   The wound breaks open (the edges do not stay together) after sutures or staples have been removed.   You have any other questions or concerns.  SEEK IMMEDIATE MEDICAL CARE IF:    You have a fever.   You develop swelling of your calf or leg or there is shortness of breath, difficulty breathing, or chest pain.   You develop a rash.   You develop any reaction or side effects to medications given.   You develop increasing pain in your hip.  MAKE SURE YOU:  Understand these instructions.   Will watch your condition.   Will get help right away if you are not doing well or get worse.  Document Released: 12/27/2004 Document Revised: 05/29/2011 Document Reviewed: 04/11/2009 [facility] Patient Information 2012 [place], [place].

## 2011-12-29 ENCOUNTER — Encounter (HOSPITAL_COMMUNITY): Payer: Self-pay | Admitting: Pharmacy Technician

## 2012-01-02 ENCOUNTER — Telehealth: Payer: Self-pay | Admitting: Orthopedic Surgery

## 2012-01-02 NOTE — Telephone Encounter (Addendum)
Contacted insurer, [employer], ph# [phone] regarding pre-authorization for in-patient/admit for 02/02/12 surgery at [facility], CPT [phone], ICD( 715.95, 715.15.  Transferred to phone# [phone], [employer]; spoke with national intake coordinator, Mercy B, who gave Ref# [id number].  States a nurse will contact our office regarding any clinicals needed. *Follow up by 01/16/12 if no contact from the insurance.

## 2012-01-05 NOTE — Telephone Encounter (Signed)
Received call back from [employer], Tawni Pummel, prior authorization department;  He requests fax of patient's clinicals to his attention, to fax # [phone].  States to include notes, Xray reports, therapies, and any other reports, by 01/13/12, along with the ref# [id number].  Done.

## 2012-01-13 ENCOUNTER — Other Ambulatory Visit: Payer: Self-pay | Admitting: *Deleted

## 2012-01-13 DIAGNOSIS — Z96649 Presence of unspecified artificial hip joint: Secondary | ICD-10-CM

## 2012-01-16 NOTE — Telephone Encounter (Addendum)
Called back to [employer] - reached at ph# [phone].  Per Estell Harpin, CPT [phone] has been approved per nurse review, as of 01/06/12, for admit date 02/02/12 to [facility].  # of days to be approved upon patient's admission to hospital.  Same Notification/Pre-cert Ref# [id number].

## 2012-01-21 ENCOUNTER — Ambulatory Visit: Payer: Medicare Other | Admitting: Orthopedic Surgery

## 2012-01-27 ENCOUNTER — Encounter (HOSPITAL_COMMUNITY): Payer: Medicare Other

## 2012-01-29 ENCOUNTER — Other Ambulatory Visit: Payer: Self-pay

## 2012-01-29 ENCOUNTER — Other Ambulatory Visit: Payer: Self-pay | Admitting: *Deleted

## 2012-01-29 ENCOUNTER — Encounter (HOSPITAL_COMMUNITY)
Admission: RE | Admit: 2012-01-29 | Discharge: 2012-01-29 | Disposition: A | Discharge status: Home / Self Care | Payer: Medicare Other | Source: Ambulatory Visit | Attending: Orthopedic Surgery | Admitting: Orthopedic Surgery

## 2012-01-29 ENCOUNTER — Encounter (HOSPITAL_COMMUNITY): Payer: Self-pay | Admitting: Pharmacy Technician

## 2012-01-29 ENCOUNTER — Encounter (HOSPITAL_COMMUNITY): Payer: Self-pay

## 2012-01-29 LAB — HEMOGLOBIN AND HEMATOCRIT, BLOOD: HCT: 39.7 % (ref 39.0–52.0)

## 2012-01-29 LAB — PROTIME-INR
INR: 0.99 (ref 0.00–1.49)
Prothrombin Time: 13.3 seconds (ref 11.6–15.2)

## 2012-01-29 LAB — BASIC METABOLIC PANEL
CO2: 25 mEq/L (ref 19–32)
Calcium: 9.6 mg/dL (ref 8.4–10.5)
Chloride: 104 mEq/L (ref 96–112)
Glucose, Bld: 110 mg/dL — ABNORMAL HIGH (ref 70–99)
Sodium: 137 mEq/L (ref 135–145)

## 2012-01-29 LAB — APTT: aPTT: 28 seconds (ref 24–37)

## 2012-01-29 LAB — SURGICAL PCR SCREEN
MRSA, PCR: NEGATIVE
Staphylococcus aureus: NEGATIVE

## 2012-01-29 MED ORDER — CHLORHEXIDINE GLUCONATE 4 % EX LIQD: 60.0000 mL | Freq: Once | CUTANEOUS | Status: DC

## 2012-01-29 NOTE — Patient Instructions (Addendum)
20 MARTELL MCFADYEN  01/29/2012   Your procedure is scheduled on:  02/08/12  Report to [facility] at 06:15 AM.  Call this number if you have problems the morning of surgery: [phone]   Remember:   Do not eat or drink:After Midnight.  Take these medicines the morning of surgery with A SIP OF WATER: Lisinopril and Gabapentin   Do not wear jewelry, make-up or nail polish.  Do not wear lotions, powders, or perfumes. You may wear deodorant.  Do not shave 48 hours prior to surgery. Men may shave face and neck.  Do not bring valuables to the hospital.  Contacts, dentures or bridgework may not be worn into surgery.  Leave suitcase in the car. After surgery it may be brought to your room.  For patients admitted to the hospital, checkout time is 11:00 AM the day of discharge.   Patients discharged the day of surgery will not be allowed to drive home.  Special Instructions: CHG Shower Use Special Wash: 1/2 bottle night before surgery and 1/2 bottle morning of surgery.   Please read over the following fact sheets that you were given: Pain Booklet, MRSA Information, Surgical Site Infection Prevention, Anesthesia Post-op Instructions and Care and Recovery After Surgery    Total Hip Replacement In total hip replacement, the damaged hip is replaced with an artificial hip joint (prosthesis). The purpose of this surgery is to reduce pain and improve your range of motion. It is one of the most successful joint replacement surgeries. LET YOUR CAREGIVER KNOW ABOUT:   Allergies.   Medicines taken, including herbs, eyedrops, over-the-counter medicines, and creams.   Use of steroids (by mouth or creams).   Previous problems with anesthetics.   Family history of anesthetic problems.   Possibility of pregnancy, if this applies.   History of blood clots (thrombophlebitis).   History of bleeding or blood problems.   Previous surgery.   Other health problems.  BEFORE THE PROCEDURE   Do not eat or  drink anything for as long as directed by your caregiver before surgery.   You should be present 60 minutes before your procedure or as directed by your caregiver.  PROCEDURE An intravenous (IV) line for giving fluids will be started. You will be given medicines and gas to make you sleep (anesthetic), or you will be given medicines through a needle in your back to make you numb from the waist down. Your surgeon will take out any damaged cartilage and bone. He or she will then put in new metal, plastic, or ceramic joint surfaces to restore alignment and function to your hip. AFTER THE PROCEDURE  After the procedure, you will be taken to the recovery area where a nurse will watch and check your progress. You may have a long, narrow tube (catheter) in your bladder after surgery. The catheter helps you empty your bladder (pass your urine). Once you are awake, stable, and taking fluids well, you will be returned to your room. You will receive physical therapy until you are doing well and your caregiver feels it is safe for you to go home. If you do not have help at home, you may need to go to an extended care facility for 5 to 14 days after the procedure. Document Released: 09/15/2000 Document Revised: 05/29/2011 Document Reviewed: 04/11/2009 [facility] Patient Information 2012 [place], [place].   PATIENT INSTRUCTIONS POST-ANESTHESIA  IMMEDIATELY FOLLOWING SURGERY:  Do not drive or operate machinery for the first twenty four hours after surgery.  Do not make any important decisions for twenty four hours after surgery or while taking narcotic pain medications or sedatives.  If you develop intractable nausea and vomiting or a severe headache please notify your doctor immediately.  FOLLOW-UP:  Please make an appointment with your surgeon as instructed. You do not need to follow up with anesthesia unless specifically instructed to do so.  WOUND CARE INSTRUCTIONS (if applicable):  Keep a dry clean dressing on  the anesthesia/puncture wound site if there is drainage.  Once the wound has quit draining you may leave it open to air.  Generally you should leave the bandage intact for twenty four hours unless there is drainage.  If the epidural site drains for more than 36-48 hours please call the anesthesia department.  QUESTIONS?:  Please feel free to call your physician or the hospital operator if you have any questions, and they will be happy to assist you.

## 2012-01-29 NOTE — Progress Notes (Signed)
01/29/12 1133  OBSTRUCTIVE SLEEP APNEA  Have you ever been diagnosed with sleep apnea through a sleep study? No  Do you snore loudly (loud enough to be heard through closed doors)?  1  Do you often feel tired, fatigued, or sleepy during the daytime? 0  Has anyone observed you stop breathing during your sleep? 0  Do you have, or are you being treated for high blood pressure? 1  BMI more than 35 kg/m2? 0  Age over 72 years old? 1  Neck circumference greater than 40 cm/18 inches? 0  Gender: 1  Obstructive Sleep Apnea Score 4   Score 4 or greater  Results sent to PCP

## 2012-01-30 NOTE — OR Nursing (Signed)
Pt. Notified that surgery is Mon  02/02/2012 , not the 18th.  Knows he is the 1st case Monday and to arrive at 6:15

## 2012-02-01 NOTE — H&P (Signed)
Malik Nicholson is an 71 y.o. male.   Chief Complaint: left hip pain  HPI: Malik Nicholson is a 71 yo male with a 5 year history of pain in his left hip. He has DDD of the L-spine as well and has been treated with oral nsaids, oral analgesics as well as steroids and hip joint injection - intially joint injection relieved his hip pain. He had a pre-op MRI of the L-spine which showed non compressive DDD.   He has sucummbed to disabling hip pain in the groin lateral gluteal region as well as the anterior thigh. He came in with a relative who is a nurse who told me he has a history of heavy drinking and is a high risk for DT'S.  As far as the hip goes he has severe pain and can not do the things he wants to do such bend from the waist , sit for long periods of time or just be comfortable in his activities of daily living. I have reviewed the risks and benefits of the surgery with him and his wife and relative. 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     Past Medical History  Diagnosis Date  . High blood pressure   . Sleep apnea     stop bangcock score of 4    Past Surgical History  Procedure Date  . Shoulder surgery   . Hernia repair     Family History  Problem Relation Age of Onset  . Cancer    . Lung disease    . Diabetes     Social History:  reports that he has never smoked. He does not have any smokeless tobacco history on file. He reports that he drinks about 4.2 ounces of alcohol per week. He reports that he does not use illicit drugs.  Allergies:  Allergies  Allergen Reactions  . Lisinopril Swelling    No prescriptions prior to admission   No current facility-administered medications on file prior to encounter.   Current Outpatient Prescriptions on File Prior to Encounter  Medication Sig Dispense Refill  . atorvastatin (LIPITOR) 10 MG tablet Take 10 mg by mouth daily.      Marland Kitchen gabapentin (NEURONTIN)  100 MG capsule Take 1 capsule (100 mg total) by mouth 3 (three) times daily.  90 capsule  2  . losartan (COZAAR) 50 MG tablet Take 50 mg by mouth daily.        No results found for this or any previous visit (from the past 48 hour(s)). No results found.  Review of Systems  Constitutional: Negative for fever, chills and weight loss.  Eyes: Negative for photophobia and discharge.  Respiratory: Negative for cough.   Cardiovascular: Negative for chest pain.  Gastrointestinal: Negative for abdominal pain.  Genitourinary: Negative for dysuria and flank pain.  Musculoskeletal: Positive for back pain and joint pain.  Skin: Negative for rash.  Neurological: Positive for tingling and sensory change. Negative for dizziness, tremors, speech change, focal weakness, loss of consciousness and headaches.  Endo/Heme/Allergies: Negative for polydipsia.  Psychiatric/Behavioral: Negative for depression, suicidal ideas and substance abuse.    There were no vitals taken for this visit. Physical Exam  Constitutional: He is oriented to person, place, and time. He appears well-developed and well-nourished.       Normal body habitus   HENT:  Head: Normocephalic.  Eyes: Pupils are equal, round, and reactive to light.  Neck: Normal range of motion. No JVD present.  Cardiovascular: Normal rate and intact distal pulses.   Respiratory: Effort normal.  GI: He exhibits no distension.  Musculoskeletal:       Right shoulder: Normal.       Left shoulder: Normal.       Right elbow: Normal.      Left elbow: Normal.       Right wrist: Normal.       Left wrist: Normal.       Right hip: He exhibits decreased range of motion. He exhibits normal strength, no tenderness, no bony tenderness, no swelling, no crepitus, no deformity and no laceration.       Left hip: He exhibits decreased range of motion, tenderness, bony tenderness and crepitus. He exhibits normal strength, no swelling, no deformity and no laceration.        Right knee: He exhibits decreased range of motion, deformity, abnormal alignment and bony tenderness. He exhibits no swelling, no effusion, no ecchymosis, no laceration, no erythema, no LCL laxity, normal patellar mobility, normal meniscus and no MCL laxity. tenderness found. Medial joint line, lateral joint line and LCL tenderness noted. No MCL and no patellar tendon tenderness noted.       Left knee: He exhibits decreased range of motion, deformity, abnormal alignment and bony tenderness. He exhibits no swelling, no effusion, no ecchymosis, no laceration, no erythema, no LCL laxity, normal patellar mobility, normal meniscus and no MCL laxity. tenderness found. Medial joint line and lateral joint line tenderness noted. No MCL, no LCL and no patellar tendon tenderness noted.       Right ankle: Normal.       Lumbar back: He exhibits decreased range of motion, tenderness, bony tenderness and pain. He exhibits no swelling, no edema, no deformity, no laceration and no spasm.       Right upper leg: Normal.       Left upper leg: Normal.       Right lower leg: Normal.       Left lower leg: Normal.       Right foot: Normal.       Left foot: Normal.  Lymphadenopathy:    He has no cervical adenopathy.  Neurological: He is alert and oriented to person, place, and time. He has normal reflexes. He displays normal reflexes. No cranial nerve deficit. He exhibits normal muscle tone. Coordination normal.  Skin: Skin is warm and dry. No erythema.  Psychiatric: He has a normal mood and affect. His behavior is normal. Judgment and thought content normal.     Assessment/Plan BMET    Component Value Date/Time   NA 137 01/29/2012 1124   K 4.6 01/29/2012 1124   CL 104 01/29/2012 1124   CO2 25 01/29/2012 1124   GLUCOSE 110* 01/29/2012 1124   BUN 19 01/29/2012 1124   CREATININE 1.11 01/29/2012 1124   CALCIUM 9.6 01/29/2012 1124   GFRNONAA 64* 01/29/2012 1124   GFRAA 75* 01/29/2012 1124    CBC    Component Value Date/Time    WBC 11.7* 06/26/2007 0500   RBC 3.33* 06/26/2007 0500   HGB 13.0 01/29/2012 1124   HCT 39.7 01/29/2012 1124   PLT 385 06/26/2007 0500   MCV 89.2 06/26/2007 0500   MCHC 34.3 06/26/2007 0500   RDW 14.5 06/26/2007 0500   LYMPHSABS 1.4 06/24/2007 0950   MONOABS 1.1* 06/24/2007 0950   EOSABS 0.3 06/24/2007 0950   BASOSABS 0.0 06/24/2007 0950    The  x-rays are in the system: they show joint space narrowing, sclerotic bone, osteophytes.  I want to re-examine his leg length in the OR while he is supine   DX: OA LEFT HIP  PLAN LEFT THA    Fuller Canada 02/01/2012, 7:05 PM

## 2012-02-02 ENCOUNTER — Encounter (HOSPITAL_COMMUNITY)
Admission: RE | Disposition: A | Discharge status: Home Health | Payer: Self-pay | Source: Ambulatory Visit | Attending: Orthopedic Surgery

## 2012-02-02 ENCOUNTER — Inpatient Hospital Stay (HOSPITAL_COMMUNITY): Payer: Medicare Other | Admitting: Anesthesiology

## 2012-02-02 ENCOUNTER — Encounter (HOSPITAL_COMMUNITY): Payer: Self-pay | Admitting: *Deleted

## 2012-02-02 ENCOUNTER — Inpatient Hospital Stay (HOSPITAL_COMMUNITY)
Admission: RE | Admit: 2012-02-02 | Discharge: 2012-02-05 | DRG: 470 | Disposition: A | Discharge status: Home Health | Payer: Medicare Other | Source: Ambulatory Visit | Attending: Orthopedic Surgery | Admitting: Orthopedic Surgery

## 2012-02-02 ENCOUNTER — Encounter (HOSPITAL_COMMUNITY): Payer: Self-pay | Admitting: Anesthesiology

## 2012-02-02 ENCOUNTER — Inpatient Hospital Stay (HOSPITAL_COMMUNITY): Payer: Medicare Other

## 2012-02-02 DIAGNOSIS — M5137 Other intervertebral disc degeneration, lumbosacral region: Secondary | ICD-10-CM | POA: Diagnosis present

## 2012-02-02 DIAGNOSIS — M1610 Unilateral primary osteoarthritis, unspecified hip: Principal | ICD-10-CM | POA: Diagnosis present

## 2012-02-02 DIAGNOSIS — M169 Osteoarthritis of hip, unspecified: Secondary | ICD-10-CM

## 2012-02-02 DIAGNOSIS — G4730 Sleep apnea, unspecified: Secondary | ICD-10-CM | POA: Diagnosis present

## 2012-02-02 DIAGNOSIS — M51379 Other intervertebral disc degeneration, lumbosacral region without mention of lumbar back pain or lower extremity pain: Secondary | ICD-10-CM | POA: Diagnosis present

## 2012-02-02 DIAGNOSIS — G5790 Unspecified mononeuropathy of unspecified lower limb: Secondary | ICD-10-CM

## 2012-02-02 DIAGNOSIS — I10 Essential (primary) hypertension: Secondary | ICD-10-CM | POA: Diagnosis present

## 2012-02-02 HISTORY — PX: TOTAL HIP ARTHROPLASTY: SHX124

## 2012-02-02 SURGERY — ARTHROPLASTY, HIP, TOTAL,POSTERIOR APPROACH
Anesthesia: Spinal | Site: Hip | Laterality: Left | Wound class: Clean

## 2012-02-02 MED ORDER — BUPIVACAINE HCL (PF) 0.25 % IJ SOLN
INTRAMUSCULAR | Status: AC
  Filled 2012-02-02: qty 270

## 2012-02-02 MED ORDER — SODIUM CHLORIDE 0.9 % IR SOLN
Status: DC | PRN
  Administered 2012-02-02 (×2): 3000 mL

## 2012-02-02 MED ORDER — ACETAMINOPHEN 10 MG/ML IV SOLN
1000.0000 mg | Freq: Once | INTRAVENOUS | Status: DC
  Administered 2012-02-02: 1000 mg via INTRAVENOUS

## 2012-02-02 MED ORDER — POTASSIUM CHLORIDE IN NACL 20-0.9 MEQ/L-% IV SOLN
INTRAVENOUS | Status: DC
  Administered 2012-02-02 – 2012-02-03 (×3): via INTRAVENOUS

## 2012-02-02 MED ORDER — CELECOXIB 100 MG PO CAPS
400.0000 mg | ORAL_CAPSULE | Freq: Once | ORAL | Status: AC
  Administered 2012-02-02: 400 mg via ORAL

## 2012-02-02 MED ORDER — LOSARTAN POTASSIUM 50 MG PO TABS
50.0000 mg | ORAL_TABLET | Freq: Every day | ORAL | Status: DC
  Administered 2012-02-02 – 2012-02-04 (×3): 50 mg via ORAL
  Filled 2012-02-02 (×3): qty 1

## 2012-02-02 MED ORDER — METOCLOPRAMIDE HCL 5 MG/ML IJ SOLN: 5.0000 mg | Freq: Three times a day (TID) | INTRAMUSCULAR | Status: DC | PRN

## 2012-02-02 MED ORDER — CEFAZOLIN SODIUM 1-5 GM-% IV SOLN
INTRAVENOUS | Status: AC
  Filled 2012-02-02: qty 50

## 2012-02-02 MED ORDER — SENNOSIDES-DOCUSATE SODIUM 8.6-50 MG PO TABS: 1.0000 | ORAL_TABLET | Freq: Every evening | ORAL | Status: DC | PRN

## 2012-02-02 MED ORDER — VITAMIN B-1 100 MG PO TABS
100.0000 mg | ORAL_TABLET | Freq: Every day | ORAL | Status: DC
  Administered 2012-02-02 – 2012-02-04 (×3): 100 mg via ORAL
  Filled 2012-02-02 (×3): qty 1

## 2012-02-02 MED ORDER — OXYCODONE HCL 5 MG PO TABS
5.0000 mg | ORAL_TABLET | ORAL | Status: DC
  Administered 2012-02-02: 5 mg via ORAL
  Administered 2012-02-02 – 2012-02-03 (×2): 10 mg via ORAL
  Administered 2012-02-03 (×3): 5 mg via ORAL
  Administered 2012-02-03: 10 mg via ORAL
  Administered 2012-02-04: 5 mg via ORAL
  Filled 2012-02-02 (×2): qty 1
  Filled 2012-02-02: qty 2
  Filled 2012-02-02 (×3): qty 1
  Filled 2012-02-02 (×2): qty 2
  Filled 2012-02-02: qty 1

## 2012-02-02 MED ORDER — LORAZEPAM 2 MG/ML IJ SOLN: 1.0000 mg | Freq: Four times a day (QID) | INTRAMUSCULAR | Status: DC | PRN

## 2012-02-02 MED ORDER — ONDANSETRON HCL 4 MG PO TABS: 4.0000 mg | ORAL_TABLET | Freq: Four times a day (QID) | ORAL | Status: DC | PRN

## 2012-02-02 MED ORDER — PHENOL 1.4 % MT LIQD: 1.0000 | OROMUCOSAL | Status: DC | PRN

## 2012-02-02 MED ORDER — OXYCODONE HCL 5 MG PO TABS
ORAL_TABLET | ORAL | Status: AC
  Filled 2012-02-02: qty 1

## 2012-02-02 MED ORDER — CEFAZOLIN SODIUM-DEXTROSE 2-3 GM-% IV SOLR
INTRAVENOUS | Status: AC
  Filled 2012-02-02: qty 50

## 2012-02-02 MED ORDER — SENNA 8.6 MG PO TABS
1.0000 | ORAL_TABLET | Freq: Two times a day (BID) | ORAL | Status: DC
  Administered 2012-02-02 – 2012-02-05 (×5): 8.6 mg via ORAL
  Filled 2012-02-02 (×5): qty 1

## 2012-02-02 MED ORDER — METHOCARBAMOL 100 MG/ML IJ SOLN
500.0000 mg | Freq: Four times a day (QID) | INTRAVENOUS | Status: DC | PRN
  Filled 2012-02-02: qty 5

## 2012-02-02 MED ORDER — MIDAZOLAM HCL 2 MG/2ML IJ SOLN
INTRAMUSCULAR | Status: AC
  Filled 2012-02-02: qty 2

## 2012-02-02 MED ORDER — CEFAZOLIN SODIUM-DEXTROSE 2-3 GM-% IV SOLR
2.0000 g | Freq: Four times a day (QID) | INTRAVENOUS | Status: AC
  Administered 2012-02-02 (×2): 2 g via INTRAVENOUS
  Filled 2012-02-02 (×2): qty 50

## 2012-02-02 MED ORDER — DEXTROSE 5 % IV SOLN: 3.0000 g | INTRAVENOUS | Status: DC

## 2012-02-02 MED ORDER — FENTANYL CITRATE 0.05 MG/ML IJ SOLN
INTRAMUSCULAR | Status: DC | PRN
  Administered 2012-02-02: 25 ug via INTRAVENOUS
  Administered 2012-02-02: 50 ug via INTRAVENOUS
  Administered 2012-02-02: 25 ug via INTRATHECAL

## 2012-02-02 MED ORDER — METOCLOPRAMIDE HCL 10 MG PO TABS: 5.0000 mg | ORAL_TABLET | Freq: Three times a day (TID) | ORAL | Status: DC | PRN

## 2012-02-02 MED ORDER — FLEET ENEMA 7-19 GM/118ML RE ENEM: 1.0000 | ENEMA | Freq: Once | RECTAL | Status: AC | PRN

## 2012-02-02 MED ORDER — FENTANYL CITRATE 0.05 MG/ML IJ SOLN
INTRAMUSCULAR | Status: AC
  Filled 2012-02-02: qty 2

## 2012-02-02 MED ORDER — EPHEDRINE SULFATE 50 MG/ML IJ SOLN
INTRAMUSCULAR | Status: AC
  Filled 2012-02-02: qty 1

## 2012-02-02 MED ORDER — DIPHENHYDRAMINE HCL 12.5 MG/5ML PO ELIX: 12.5000 mg | ORAL_SOLUTION | ORAL | Status: DC | PRN

## 2012-02-02 MED ORDER — LORAZEPAM 1 MG PO TABS
0.0000 mg | ORAL_TABLET | Freq: Four times a day (QID) | ORAL | Status: AC
  Administered 2012-02-02: 1 mg via ORAL
  Filled 2012-02-02: qty 1

## 2012-02-02 MED ORDER — PROPOFOL 10 MG/ML IV EMUL
INTRAVENOUS | Status: DC | PRN
  Administered 2012-02-02: 09:00:00 via INTRAVENOUS
  Administered 2012-02-02: 25 ug/kg/min via INTRAVENOUS

## 2012-02-02 MED ORDER — BUPIVACAINE IN DEXTROSE 0.75-8.25 % IT SOLN
INTRATHECAL | Status: DC | PRN
  Administered 2012-02-02: 15 mg via INTRATHECAL

## 2012-02-02 MED ORDER — BUPIVACAINE-EPINEPHRINE PF 0.5-1:200000 % IJ SOLN
INTRAMUSCULAR | Status: DC | PRN
  Administered 2012-02-02: 60 mL

## 2012-02-02 MED ORDER — CELECOXIB 100 MG PO CAPS
200.0000 mg | ORAL_CAPSULE | Freq: Two times a day (BID) | ORAL | Status: DC
  Administered 2012-02-02 – 2012-02-05 (×6): 200 mg via ORAL
  Filled 2012-02-02 (×6): qty 2

## 2012-02-02 MED ORDER — GABAPENTIN 100 MG PO CAPS
100.0000 mg | ORAL_CAPSULE | Freq: Three times a day (TID) | ORAL | Status: DC
  Administered 2012-02-02 – 2012-02-05 (×8): 100 mg via ORAL
  Filled 2012-02-02 (×8): qty 1

## 2012-02-02 MED ORDER — ONDANSETRON HCL 4 MG/2ML IJ SOLN
INTRAMUSCULAR | Status: AC
  Filled 2012-02-02: qty 2

## 2012-02-02 MED ORDER — ALUM & MAG HYDROXIDE-SIMETH 200-200-20 MG/5ML PO SUSP: 30.0000 mL | ORAL | Status: DC | PRN

## 2012-02-02 MED ORDER — FENTANYL CITRATE 0.05 MG/ML IJ SOLN: 25.0000 ug | INTRAMUSCULAR | Status: DC | PRN

## 2012-02-02 MED ORDER — LACTATED RINGERS IV SOLN
INTRAVENOUS | Status: DC
  Administered 2012-02-02: 09:00:00 via INTRAVENOUS
  Administered 2012-02-02: 1000 mL via INTRAVENOUS

## 2012-02-02 MED ORDER — BUPIVACAINE-EPINEPHRINE PF 0.5-1:200000 % IJ SOLN
INTRAMUSCULAR | Status: AC
  Filled 2012-02-02: qty 10

## 2012-02-02 MED ORDER — ACETAMINOPHEN 10 MG/ML IV SOLN
1000.0000 mg | Freq: Four times a day (QID) | INTRAVENOUS | Status: AC
  Administered 2012-02-03 (×2): 1000 mg via INTRAVENOUS
  Filled 2012-02-02 (×3): qty 100

## 2012-02-02 MED ORDER — OXYCODONE HCL 5 MG PO TABS
5.0000 mg | ORAL_TABLET | Freq: Once | ORAL | Status: AC
  Administered 2012-02-02: 5 mg via ORAL

## 2012-02-02 MED ORDER — METHOCARBAMOL 100 MG/ML IJ SOLN
500.0000 mg | Freq: Once | INTRAVENOUS | Status: AC
  Administered 2012-02-02: 500 mg via INTRAVENOUS
  Filled 2012-02-02: qty 5

## 2012-02-02 MED ORDER — BUPIVACAINE-EPINEPHRINE PF 0.5-1:200000 % IJ SOLN
INTRAMUSCULAR | Status: AC
  Filled 2012-02-02: qty 20

## 2012-02-02 MED ORDER — THIAMINE HCL 100 MG/ML IJ SOLN
100.0000 mg | Freq: Every day | INTRAMUSCULAR | Status: DC
  Filled 2012-02-02: qty 2

## 2012-02-02 MED ORDER — SODIUM CHLORIDE 0.9 % IR SOLN
Status: DC | PRN
  Administered 2012-02-02: 1000 mL

## 2012-02-02 MED ORDER — CELECOXIB 100 MG PO CAPS
ORAL_CAPSULE | ORAL | Status: AC
  Filled 2012-02-02: qty 4

## 2012-02-02 MED ORDER — MIDAZOLAM HCL 5 MG/5ML IJ SOLN
INTRAMUSCULAR | Status: DC | PRN
  Administered 2012-02-02 (×2): 2 mg via INTRAVENOUS

## 2012-02-02 MED ORDER — PROPOFOL 10 MG/ML IV BOLUS
INTRAVENOUS | Status: DC | PRN
  Administered 2012-02-02: 20 mg via INTRAVENOUS
  Administered 2012-02-02 (×2): 10 mg via INTRAVENOUS

## 2012-02-02 MED ORDER — ONDANSETRON HCL 4 MG/2ML IJ SOLN: 4.0000 mg | Freq: Four times a day (QID) | INTRAMUSCULAR | Status: DC | PRN

## 2012-02-02 MED ORDER — ADULT MULTIVITAMIN W/MINERALS CH
1.0000 | ORAL_TABLET | Freq: Every day | ORAL | Status: DC
  Administered 2012-02-02 – 2012-02-04 (×3): 1 via ORAL
  Filled 2012-02-02 (×2): qty 1

## 2012-02-02 MED ORDER — ATORVASTATIN CALCIUM 10 MG PO TABS
10.0000 mg | ORAL_TABLET | Freq: Every day | ORAL | Status: DC
  Administered 2012-02-03 – 2012-02-04 (×2): 10 mg via ORAL
  Filled 2012-02-02 (×2): qty 1

## 2012-02-02 MED ORDER — LORAZEPAM 1 MG PO TABS: 0.0000 mg | ORAL_TABLET | Freq: Two times a day (BID) | ORAL | Status: DC

## 2012-02-02 MED ORDER — BUPIVACAINE IN DEXTROSE 0.75-8.25 % IT SOLN
INTRATHECAL | Status: AC
  Filled 2012-02-02: qty 2

## 2012-02-02 MED ORDER — BISACODYL 5 MG PO TBEC
5.0000 mg | DELAYED_RELEASE_TABLET | Freq: Every day | ORAL | Status: DC | PRN
  Administered 2012-02-04: 5 mg via ORAL
  Filled 2012-02-02: qty 1

## 2012-02-02 MED ORDER — BUPIVACAINE 0.25 % ON-Q PUMP SINGLE CATH 300ML
INJECTION | Status: DC | PRN
  Administered 2012-02-02: 270 mL

## 2012-02-02 MED ORDER — MENTHOL 3 MG MT LOZG: 1.0000 | LOZENGE | OROMUCOSAL | Status: DC | PRN

## 2012-02-02 MED ORDER — HYDROMORPHONE HCL PF 1 MG/ML IJ SOLN
0.5000 mg | INTRAMUSCULAR | Status: DC | PRN
  Administered 2012-02-02 – 2012-02-03 (×3): 0.5 mg via INTRAVENOUS
  Filled 2012-02-02 (×3): qty 1

## 2012-02-02 MED ORDER — EPHEDRINE SULFATE 50 MG/ML IJ SOLN
INTRAMUSCULAR | Status: DC | PRN
  Administered 2012-02-02: 10 mg via INTRAVENOUS
  Administered 2012-02-02: 5 mg via INTRAVENOUS

## 2012-02-02 MED ORDER — ONDANSETRON HCL 4 MG/2ML IJ SOLN
4.0000 mg | Freq: Once | INTRAMUSCULAR | Status: AC | PRN
  Administered 2012-02-02: 4 mg via INTRAVENOUS

## 2012-02-02 MED ORDER — ONDANSETRON HCL 4 MG/2ML IJ SOLN: 4.0000 mg | Freq: Once | INTRAMUSCULAR | Status: DC

## 2012-02-02 MED ORDER — MIDAZOLAM HCL 2 MG/2ML IJ SOLN
1.0000 mg | INTRAMUSCULAR | Status: DC | PRN
  Administered 2012-02-02: 2 mg via INTRAVENOUS

## 2012-02-02 MED ORDER — LORAZEPAM 1 MG PO TABS: 1.0000 mg | ORAL_TABLET | Freq: Four times a day (QID) | ORAL | Status: DC | PRN

## 2012-02-02 MED ORDER — ACETAMINOPHEN 10 MG/ML IV SOLN
INTRAVENOUS | Status: AC
  Filled 2012-02-02: qty 100

## 2012-02-02 MED ORDER — FOLIC ACID 1 MG PO TABS
1.0000 mg | ORAL_TABLET | Freq: Every day | ORAL | Status: DC
  Administered 2012-02-02 – 2012-02-04 (×3): 1 mg via ORAL
  Filled 2012-02-02 (×3): qty 1

## 2012-02-02 MED ORDER — DOCUSATE SODIUM 100 MG PO CAPS
100.0000 mg | ORAL_CAPSULE | Freq: Two times a day (BID) | ORAL | Status: DC
  Administered 2012-02-02 – 2012-02-05 (×6): 100 mg via ORAL
  Filled 2012-02-02 (×6): qty 1

## 2012-02-02 MED ORDER — PROPOFOL 10 MG/ML IV EMUL
INTRAVENOUS | Status: AC
  Filled 2012-02-02: qty 20

## 2012-02-02 MED ORDER — CEFAZOLIN SODIUM-DEXTROSE 2-3 GM-% IV SOLR
2.0000 g | INTRAVENOUS | Status: AC
  Administered 2012-02-02: 3 g via INTRAVENOUS

## 2012-02-02 MED ORDER — METHOCARBAMOL 500 MG PO TABS
500.0000 mg | ORAL_TABLET | Freq: Four times a day (QID) | ORAL | Status: DC | PRN
  Administered 2012-02-03: 500 mg via ORAL
  Filled 2012-02-02: qty 1

## 2012-02-02 MED ORDER — ENOXAPARIN SODIUM 40 MG/0.4ML ~~LOC~~ SOLN
40.0000 mg | SUBCUTANEOUS | Status: DC
  Administered 2012-02-04 – 2012-02-05 (×2): 40 mg via SUBCUTANEOUS
  Filled 2012-02-02 (×2): qty 0.4

## 2012-02-02 SURGICAL SUPPLY — 66 items
106364 ×2 IMPLANT
BIT DRILL 2.8X128 (BIT) ×2 IMPLANT
BLADE HEX COATED 2.75 (ELECTRODE) ×2 IMPLANT
BLADE SAGITTAL 25.0X1.27X90 (BLADE) ×2 IMPLANT
BRUSH FEMORAL CANAL (MISCELLANEOUS) IMPLANT
CATH KIT ON Q 2.5IN SLV (PAIN MANAGEMENT) ×2 IMPLANT
CHLORAPREP W/TINT 26ML (MISCELLANEOUS) ×4 IMPLANT
CLOTH BEACON ORANGE TIMEOUT ST (SAFETY) ×2 IMPLANT
COVER LIGHT HANDLE STERIS (MISCELLANEOUS) ×4 IMPLANT
COVER PROBE W GEL 5X96 (DRAPES) ×2 IMPLANT
DECANTER SPIKE VIAL GLASS SM (MISCELLANEOUS) ×24 IMPLANT
DRAPE BACK TABLE (DRAPES) ×2 IMPLANT
DRAPE HIP W/POCKET STRL (DRAPE) ×2 IMPLANT
DRAPE INCISE IOBAN 44X35 STRL (DRAPES) ×2 IMPLANT
DRAPE U-SHAPE 47X51 STRL (DRAPES) ×2 IMPLANT
DRSG MEPILEX BORDER 4X12 (GAUZE/BANDAGES/DRESSINGS) ×2 IMPLANT
ELECT REM PT RETURN 9FT ADLT (ELECTROSURGICAL) ×2
ELECTRODE REM PT RTRN 9FT ADLT (ELECTROSURGICAL) ×1 IMPLANT
FACESHIELD OPICON STD (MASK) ×2 IMPLANT
GLOVE BIOGEL PI IND STRL 7.0 (GLOVE) ×2 IMPLANT
GLOVE BIOGEL PI IND STRL 8 (GLOVE) ×1 IMPLANT
GLOVE BIOGEL PI INDICATOR 7.0 (GLOVE) ×2
GLOVE BIOGEL PI INDICATOR 8 (GLOVE) ×1
GLOVE ECLIPSE 6.5 STRL STRAW (GLOVE) ×4 IMPLANT
GLOVE EXAM NITRILE MD LF STRL (GLOVE) ×2 IMPLANT
GLOVE OPTIFIT SS 8.0 STRL (GLOVE) ×2 IMPLANT
GLOVE SKINSENSE NS SZ8.0 LF (GLOVE) ×2
GLOVE SKINSENSE STRL SZ8.0 LF (GLOVE) ×2 IMPLANT
GLOVE SS BIOGEL STRL SZ 8 (GLOVE) ×1 IMPLANT
GLOVE SS N UNI LF 8.5 STRL (GLOVE) ×2 IMPLANT
GLOVE SUPERSENSE BIOGEL SZ 8 (GLOVE) ×1
GOWN STRL REIN XL XLG (GOWN DISPOSABLE) ×6 IMPLANT
HANDPIECE INTERPULSE COAX TIP (DISPOSABLE) ×1
HOOD W/PEELAWAY (MISCELLANEOUS) ×8 IMPLANT
INST SET MAJOR BONE (KITS) ×2 IMPLANT
IV NS IRRIG 3000ML ARTHROMATIC (IV SOLUTION) ×4 IMPLANT
KIT BLADEGUARD II DBL (SET/KITS/TRAYS/PACK) ×2 IMPLANT
KIT ROOM TURNOVER APOR (KITS) ×2 IMPLANT
MANIFOLD NEPTUNE II (INSTRUMENTS) ×2 IMPLANT
MARKER SKIN DUAL TIP RULER LAB (MISCELLANEOUS) ×2 IMPLANT
NEEDLE HYPO 21X1.5 SAFETY (NEEDLE) ×2 IMPLANT
NS IRRIG 1000ML POUR BTL (IV SOLUTION) ×2 IMPLANT
PACK TOTAL JOINT (CUSTOM PROCEDURE TRAY) ×2 IMPLANT
PAD ARMBOARD 7.5X6 YLW CONV (MISCELLANEOUS) ×2 IMPLANT
PAIN PUMP ON-Q 270MLX4ML 5IN (PAIN MANAGEMENT) ×2 IMPLANT
PASSER SUT SWANSON 36MM LOOP (INSTRUMENTS) ×2 IMPLANT
PILLOW HIP ABDUCTION LRG (ORTHOPEDIC SUPPLIES) ×2 IMPLANT
PILLOW HIP ABDUCTION MED (ORTHOPEDIC SUPPLIES) IMPLANT
PIN STMN 9X.142 IN (PIN) ×4 IMPLANT
SET BASIN LINEN APH (SET/KITS/TRAYS/PACK) ×2 IMPLANT
SET HNDPC FAN SPRY TIP SCT (DISPOSABLE) ×1 IMPLANT
SPONGE LAP 18X18 X RAY DECT (DISPOSABLE) ×4 IMPLANT
STAPLER VISISTAT 35W (STAPLE) ×2 IMPLANT
SUT BRALON NAB BRD #1 30IN (SUTURE) ×4 IMPLANT
SUT ETHIBOND 5 LR DA (SUTURE) ×4 IMPLANT
SUT MNCRL 0 VIOLET CTX 36 (SUTURE) ×1 IMPLANT
SUT MON AB 2-0 CT1 36 (SUTURE) ×2 IMPLANT
SUT MONOCRYL 0 CTX 36 (SUTURE) ×1
SUT VIC AB 1 CT1 27 (SUTURE) ×3
SUT VIC AB 1 CT1 27XBRD ANTBC (SUTURE) ×3 IMPLANT
SYR 30ML LL (SYRINGE) ×2 IMPLANT
SYR BULB IRRIGATION 50ML (SYRINGE) ×2 IMPLANT
TOWEL OR 17X26 4PK STRL BLUE (TOWEL DISPOSABLE) ×2 IMPLANT
TOWER CARTRIDGE SMART MIX (DISPOSABLE) IMPLANT
TRAY FOLEY CATH 14FR (SET/KITS/TRAYS/PACK) ×2 IMPLANT
YANKAUER SUCT 12FT TUBE ARGYLE (SUCTIONS) ×2 IMPLANT

## 2012-02-02 NOTE — Progress Notes (Signed)
Ready for d/c to room. Nurse unable to accept report at this time. Nurse will call back for report.

## 2012-02-02 NOTE — Progress Notes (Signed)
Resting quietly. No movement of lower extremities. Sensation level remains at T12.

## 2012-02-02 NOTE — Anesthesia Procedure Notes (Signed)
Spinal  Patient location during procedure: OR Start time: 02/02/2012 8:19 AM Staffing CRNA/Resident: Glynn Octave E Preanesthetic Checklist Completed: patient identified, site marked, surgical consent, pre-op evaluation, timeout performed, IV checked, risks and benefits discussed and monitors and equipment checked Spinal Block Patient position: left lateral decubitus Prep: Betadine Patient monitoring: heart rate, cardiac monitor, continuous pulse ox and blood pressure Approach: left paramedian Location: L3-4 Injection technique: single-shot Needle Needle type: Spinocan  Needle gauge: 22 G Needle length: 9 cm Assessment Sensory level: T8 Additional Notes  ATTEMPTS:1 TRAY: [id number]  EXPIRATION DATE: 11/2012

## 2012-02-02 NOTE — Brief Op Note (Signed)
02/02/2012  10:47 AM  PATIENT:  Malik Nicholson  71 y.o. male  PRE-OPERATIVE DIAGNOSIS:  osteoarthritis left hip POST-OPERATIVE DIAGNOSIS:  left hip osteoarthritis  PROCEDURE:  Procedure(s) (LRB): TOTAL HIP ARTHROPLASTY (Left)  SURGEON:  Surgeon(s) and Role:    * Vickki Hearing, MD - Primary  PHYSICIAN ASSISTANT:   ASSISTANTS: wayne mcfatter and debi dallas   ANESTHESIA:   Spinal  EBL:  Total I/O In: 1600 [I.V.:1600] Out: 500 [Urine:300; Blood:200]  BLOOD ADMINISTERED:none  DRAINS: none   LOCAL MEDICATIONS USED:  MARCAINE   , Amount: 60 ml and OTHER epi  SPECIMEN:  No Specimen  DISPOSITION OF SPECIMEN:  N/A  COUNTS:  YES  TOURNIQUET:  * No tourniquets in log *  DICTATION: .Dragon Dictation  PLAN OF CARE: Admit to inpatient   PATIENT DISPOSITION:  PACU - hemodynamically stable.   Delay start of Pharmacological VTE agent (>24hrs) due to surgical blood loss or risk of bleeding: yes

## 2012-02-02 NOTE — Anesthesia Preprocedure Evaluation (Signed)
Anesthesia Evaluation  Patient identified by MRN, date of birth, ID band Patient awake    Reviewed: Allergy & Precautions, H&P , NPO status , Patient's Chart, lab work & pertinent test results  History of Anesthesia Complications Negative for: history of anesthetic complications  Airway Mallampati: II      Dental  (+) Teeth Intact   Pulmonary neg pulmonary ROS,          Cardiovascular hypertension, Pt. on medications Rhythm:Regular Rate:Normal     Neuro/Psych  Neuromuscular disease    GI/Hepatic   Endo/Other    Renal/GU      Musculoskeletal   Abdominal   Peds  Hematology   Anesthesia Other Findings   Reproductive/Obstetrics                           Anesthesia Physical Anesthesia Plan  ASA: II  Anesthesia Plan: Spinal   Post-op Pain Management:    Induction:   Airway Management Planned: Nasal Cannula  Additional Equipment:   Intra-op Plan:   Post-operative Plan:   Informed Consent: I have reviewed the patients History and Physical, chart, labs and discussed the procedure including the risks, benefits and alternatives for the proposed anesthesia with the patient or authorized representative who has indicated his/her understanding and acceptance.     Plan Discussed with:   Anesthesia Plan Comments:         Anesthesia Quick Evaluation

## 2012-02-02 NOTE — Op Note (Signed)
02/02/2012  10:47 AM  PATIENT:  Malik Nicholson  71 y.o. male  PRE-OPERATIVE DIAGNOSIS:  osteoarthritis left hip POST-OPERATIVE DIAGNOSIS:  left hip osteoarthritis  PROCEDURE:  Procedure(s) (LRB): TOTAL HIP ARTHROPLASTY (Left)  DEPUY GRIPTION CUP NO SCREWS 58. SIZE 10 TRILOCK STEM HO 1.5 NECK AND 36 HIP HEAD 58 LINER    Indications disabling pain LEFT hip secondary to osteoarthritis.  The patient was identified in the preop holding area and the surgical site was marked and countersigned by the surgeon, the chart was updated. The consent was signed.  The patient was taken to the operating room for spinal anesthetic and then placed in the lateral decubitus position with appropriate padding and axillary roll. 3g of ANCEF were given because of the patient's weight of >80KG  After sterile prep and drape the timeout was executed  A lateral incision was made centered over the LEFT greater trochanter to perform a direct lateral approach to the hip. After dividing the subcutaneous tissue down to the fascia, the fascia was split in line with the skin incision. Electrocautery was used to obtain hemostasis.   After deep retractors were placed, the gluteus medius was defined and the anterior half was peeled from the group trochanter in continuity with the vastus lateralis; the anterior branch to the femoral circumflex artery was cauterized. Subperiosteal dissection continued until the gluteus minimus along with the gluteus medius was retracted proximally.  2 Steinmann pins were placed in the pelvis to retract the Glutei. The hip was dislocated anteriorly, a provisional femoral neck cut was made using the cutting guide. The lesser trochanter was then identified with further soft tissue dissection and a second femoral neck cut was made using the same guide.   A box osteotome was used to lateralize entry point into the femoral canal, this was followed by a femoral canal finder and subsequent broaching up  to a size 10 femur.  The acetabulum was then cleared of all soft tissue and anterior and posterior retractors were placed. Direct medial reaming was performed with a 43, and 44 mm reamer. Once the medial wall was identified, positional reaming continued up to a size 57mm, with a goal of 40 degrees abduction and anatomic anteversion. A trial 57 AND 58 mm liner was placed to confirm adequate depth. Once this was accomplished, the final 2 remaining reamings were placed into the acetabulum and a 58 mm cup was placed in press-fit fashion and confirmed to be stable.   Trial reductions were then performed starting with 1.5. Leg length and stability were restored with THAT. We confirmed knee flexion past 90. ROM: 5 hyperextension with 50 of external rotation. We had adequate shuck test. Sleeping position was stable. At 90 flexion the hip internally rotated 50 without dislocation.  The trial components were then removed, the polyethylene size 58 was secured; drill holes were placed in the trochanter and  #5 sutures were passed through the drill holes; the stem was placed followed by the femoral head.  The hip was reduced. The ROM tests were repeated and were satisfactory.The # 5 sutures were used to repair the abductors with the leg slightly internally rotated.  The wound was irrigated and 30 cc of Marcaine with epinephrine was injected into the sub-gluteus medius area. Fascia was closed with the leg abducted with #1 Bralon sutures; followed by subfascial injection of 30 cc of Marcaine with epinephrine.  Subcutaneous tissue was closed with 0 AND 2-0  Monocryl over a pain pump catheter  Skin  staples were used to reapproximate the skin edges and a sterile dressing was applied. The  pain pump catheter was activated.  The patient was taken to the recovery room in stable condition.  Standard postop total hip arthroplasty protocol for direct lateral approach.  SURGEON:  Surgeon(s) and Role:    * Vickki Hearing, MD - Primary  PHYSICIAN ASSISTANT:   ASSISTANTS: wayne mcfatter and debi dallas   ANESTHESIA:   Spinal  EBL:  Total I/O In: 1600 [I.V.:1600] Out: 500 [Urine:300; Blood:200]  BLOOD ADMINISTERED:none  DRAINS: none   LOCAL MEDICATIONS USED:  MARCAINE   , Amount: 60 ml and OTHER epi  SPECIMEN:  No Specimen  DISPOSITION OF SPECIMEN:  N/A  COUNTS:  YES  TOURNIQUET:  * No tourniquets in log *  DICTATION: .Dragon Dictation  PLAN OF CARE: Admit to inpatient   PATIENT DISPOSITION:  PACU - hemodynamically stable.   Delay start of Pharmacological VTE agent (>24hrs) due to surgical blood loss or risk of bleeding: yes

## 2012-02-02 NOTE — Anesthesia Postprocedure Evaluation (Signed)
  Anesthesia Post-op Note  Patient: Malik Nicholson  Procedure(s) Performed: Procedure(s) (LRB): TOTAL HIP ARTHROPLASTY (Left)  Patient Location: PACU  Anesthesia Type: Spinal  Level of Consciousness: awake, alert  and oriented  Airway and Oxygen Therapy: Patient Spontanous Breathing and Patient connected to nasal cannula oxygen  Post-op Pain: none  Post-op Assessment: Post-op Vital signs reviewed, Patient's Cardiovascular Status Stable, Respiratory Function Stable, Patent Airway and No signs of Nausea or vomiting  Post-op Vital Signs: Reviewed and stable  Complications: No apparent anesthesia complications

## 2012-02-02 NOTE — Progress Notes (Signed)
Blood drawn for type, cross 2 units PRBC

## 2012-02-02 NOTE — Progress Notes (Signed)
Awake. On-Q pain pump in place and infusing at 2 ml/hr.

## 2012-02-02 NOTE — Transfer of Care (Signed)
Immediate Anesthesia Transfer of Care Note  Patient: Malik Nicholson  Procedure(s) Performed: Procedure(s) (LRB): TOTAL HIP ARTHROPLASTY (Left)  Patient Location: PACU  Anesthesia Type: Spinal  Level of Consciousness: awake, alert  and oriented  Airway & Oxygen Therapy: Patient Spontanous Breathing  Post-op Assessment: Report given to PACU RN  Post vital signs: Reviewed and stable  Complications: No apparent anesthesia complications

## 2012-02-02 NOTE — Interval H&P Note (Signed)
History and Physical Interval Note:  02/02/2012 7:30 AM  Malik Nicholson  has presented today for surgery, with the diagnosis of left hip osteoarthritis  The various methods of treatment have been discussed with the patient and family. After consideration of risks, benefits and other options for treatment, the patient has consented to  Procedure(s) (LRB):LEFT TOTAL HIP ARTHROPLASTY as a surgical intervention .  The patient's history has been reviewed, patient examined, no change in status, stable for surgery.  I have reviewed the patient's chart and labs.  Questions were answered to the patient's satisfaction.     Fuller Canada

## 2012-02-02 NOTE — Progress Notes (Signed)
AP pelvis xray done. 

## 2012-02-03 LAB — BASIC METABOLIC PANEL
BUN: 13 mg/dL (ref 6–23)
CO2: 25 mEq/L (ref 19–32)
Chloride: 101 mEq/L (ref 96–112)
GFR calc Af Amer: >90 mL/min (ref >90)
Potassium: 5 mEq/L (ref 3.5–5.1)

## 2012-02-03 LAB — CBC
HCT: 33 % — ABNORMAL LOW (ref 39.0–52.0)
RBC: 3.66 MIL/uL — ABNORMAL LOW (ref 4.22–5.81)
RDW: 15 % (ref 11.5–15.5)
WBC: 7.5 10*3/uL (ref 4.0–10.5)

## 2012-02-03 MED ORDER — ACETAMINOPHEN 10 MG/ML IV SOLN
INTRAVENOUS | Status: AC
  Filled 2012-02-03: qty 200

## 2012-02-03 NOTE — Anesthesia Postprocedure Evaluation (Signed)
Anesthesia Post Note  Patient: Malik Nicholson  Procedure(s) Performed: Procedure(s) (LRB): TOTAL HIP ARTHROPLASTY (Left)  Anesthesia type: Spinal  Patient location: 304  Post pain: Pain level controlled  Post assessment: Post-op Vital signs reviewed, Patient's Cardiovascular Status Stable, Respiratory Function Stable, Patent Airway, No signs of Nausea or vomiting and Pain level controlled  Last Vitals:  Filed Vitals:   02/03/12 1338  BP: 109/72  Pulse: 80  Temp: 36.6 C  Resp: 20    Post vital signs: Reviewed and stable  Level of consciousness: awake and alert   Complications: No apparent anesthesia complications

## 2012-02-03 NOTE — Progress Notes (Signed)
Subjective: 1 Day Post-Op Procedure(s) (LRB): TOTAL HIP ARTHROPLASTY (Left) Patient reports pain as 0-5.    Objective: Vital signs in last 24 hours: Temp:  [95.7 F (35.4 C)-98.2 F (36.8 C)] 97.8 F (36.6 C) (08/13 0545) Pulse Rate:  [46-88] 83  (08/13 0545) Resp:  [11-20] 20  (08/13 0545) BP: (94-132)/(50-77) 94/50 mmHg (08/13 0545) SpO2:  [95 %-100 %] 97 % (08/13 0545) Weight:  [215 lb 9.8 oz (97.8 kg)] 215 lb 9.8 oz (97.8 kg) (08/12 1400)  Intake/Output from previous day: 08/12 0701 - 08/13 0700 In: 3533.3 [P.O.:240; I.V.:3293.3] Out: 1070 [Urine:870; Blood:200] Intake/Output this shift:     Basename 02/03/12 0447  HGB 10.8*    Basename 02/03/12 0447  WBC 7.5  RBC 3.66*  HCT 33.0*  PLT 195    Basename 02/03/12 0447  NA 135  K 5.0  CL 101  CO2 25  BUN 13  CREATININE 0.92  GLUCOSE 131*  CALCIUM 8.8   No results found for this basename: LABPT:2,INR:2 in the last 72 hours  Neurologically intact Neurovascular intact Sensation intact distally Intact pulses distally Dorsiflexion/Plantar flexion intact Incision: mild bleeding from the end of the wound   Assessment/Plan: 1 Day Post-Op Procedure(s) (LRB): TOTAL HIP ARTHROPLASTY (Left) Advance diet Up with therapy D/C IV fluids  Fuller Canada 02/03/2012, 8:42 AM

## 2012-02-03 NOTE — Care Management Note (Signed)
    Page 1 of 2   02/05/2012     11:13:26 AM   CARE MANAGEMENT NOTE 02/05/2012  Patient:  Malik Nicholson, Malik Nicholson   Account Number:  [id number]  Date Initiated:  02/03/2012  Documentation initiated by:  Sharrie Rothman  Subjective/Objective Assessment:   Pt admitted from home s/p left total hip replacement. Pt lives with wife and will return home at discharge. Pt was fairly independent with ADL's PTA.     Action/Plan:   Cm will arrange HH per pts choice. Pt stated that he did not want a walker due to borrowing one from a neighbor. Pt did state he wanted the BSC/3N1. Will discuss with wife once she is available to help pt make decision for [facility] and DME.   Anticipated DC Date:  02/05/2012   Anticipated DC Plan:  HOME W HOME HEALTH SERVICES  In-house referral  Clinical Social Worker      DC Planning Services  CM consult      [facility] Choice  HOME HEALTH  DURABLE MEDICAL EQUIPMENT   Choice offered to / List presented to:  C-1 Patient   DME arranged  3-N-1  BEDSIDE COMMODE      DME agency  [facility]     HH arranged  HH-2 PT  HH-1 RN  HH-3 OT      [facility] agency  [facility]   Status of service:  Completed, signed off Medicare Important Message given?   (If response is "NO", the following Medicare IM given date fields will be blank) Date Medicare IM given:   Date Additional Medicare IM given:    Discharge Disposition:  HOME W HOME HEALTH SERVICES  Per UR Regulation:    If discussed at Long Length of Stay Meetings, dates discussed:    Comments:  02/05/12 1111 Arlyss Queen, RN BSN CM Pt discharged home today with [facility] RN, PT, and OT. Orders and face to face sent to DeeDee at [facility]. Order also faxed for 3 N 1 and BSC sent to [facility] to have DME sent to pts home. Pt and pts nurse is aware of pts discharge arrrangements.  02/04/12 1432 Arlyss Queen, RN BSN CM Pt provided letter from [employer] for pre approval for [facility] PT with [facility].  Pts HH was arranged by pts surgeons office before surgery. Deedee with [facility] notified of pts potential discharge on 8/15. Also notified of pts need for [facility] and 3 N 1 and [facility] will make those arrangements for DME to be delivered to pts home at discharge. Pt is using a friends rolling walker. CM will send orders and face to face prior to discharge.  02/03/12 1132 Arlyss Queen,  RN BSN CM

## 2012-02-03 NOTE — Progress Notes (Signed)
UR chart review completed.  

## 2012-02-03 NOTE — Clinical Social Work Note (Signed)
CSW received consult for SNF placement. Pt did well with PT and recommendation is for home health. CM aware. CSW will sign off unless further needs arise prior to d/c.  Derenda Fennel, LCSW [phone]

## 2012-02-03 NOTE — Addendum Note (Signed)
Addendum  created 02/03/12 1632 by Franco Nones, CRNA   Modules edited:Notes Section

## 2012-02-03 NOTE — Evaluation (Signed)
Occupational Therapy Evaluation Patient Details Name: Malik Nicholson MRN: [id number] DOB: Nov 14, 1940 Today's Date: 02/03/2012 Time: 8295-6213 OT Time Calculation (min): 24 min  OT Assessment / Plan / Recommendation Clinical Impression  patient is a 71 y/o male s/p Total Hip presenting to acute OT with deficits below. Patient will benefit from acute OT to increase ADL performance, functional transfers, and provide family/patient education for D/C. Patient would benefit from [facility] OT initially at D/C.    OT Assessment  Patient needs continued OT Services    Follow Up Recommendations  Home health OT       Equipment Recommendations  Rolling walker with 5" wheels;3 in 1 bedside comode       Frequency  Min 2X/week    Precautions / Restrictions Precautions Precautions: Anterior Hip Precaution Booklet Issued: Yes (comment) Precaution Comments: Patient was given hip precautions handout. Educated patient and wife regarding hip precautions. Restrictions Weight Bearing Restrictions: No LLE Weight Bearing: Weight bearing as tolerated   Pertinent Vitals/Pain No reports of pain.    ADL  Lower Body Dressing: Maximal assistance;Performed Where Assessed - Lower Body Dressing: Unsupported sit to stand Toilet Transfer: Performed;Minimal assistance Toilet Transfer Method: Other (comment) (ambulating) Toilet Transfer Equipment:  (to recliner) Equipment Used: Gait belt;Rolling walker Transfers/Ambulation Related to ADLs: Patient transfers at [employer] level with RW. Patient ambulated from left side of bed to doorway and back. ADL Comments: Will show patient hip kit tomorrow during tx session.    OT Diagnosis: Generalized weakness  OT Problem List: Decreased strength;Impaired balance (sitting and/or standing);Decreased knowledge of use of DME or AE;Decreased safety awareness OT Treatment Interventions: Self-care/ADL training;DME and/or AE instruction;Therapeutic activities;Balance  training;Patient/family education;Therapeutic exercise   OT Goals Acute Rehab OT Goals OT Goal Formulation: With patient Time For Goal Achievement: 02/17/12 Potential to Achieve Goals: Good ADL Goals Pt Will Perform Lower Body Bathing: with min assist;with adaptive equipment;Sit to stand from bed ADL Goal: Lower Body Bathing - Progress: Goal set today Pt Will Perform Lower Body Dressing: with min assist;with adaptive equipment;Sit to stand from bed ADL Goal: Lower Body Dressing - Progress: Goal set today Pt Will Transfer to Toilet: with supervision;Ambulation;3-in-1;Maintaining hip precautions ADL Goal: Toilet Transfer - Progress: Goal set today Pt Will Perform Toileting - Clothing Manipulation: with supervision;Sitting on 3-in-1 or toilet ADL Goal: Toileting - Clothing Manipulation - Progress: Goal set today Pt Will Perform Toileting - Hygiene: with supervision;Sit to stand from 3-in-1/toilet ADL Goal: Toileting - Hygiene - Progress: Goal set today Arm Goals Pt Will Complete Theraband Exer: with supervision, verbal cues required/provided;to maintain strength;1 set;Bilateral upper extremities;Level 1 Theraband Arm Goal: Theraband Exercises - Progress: Goal set today Miscellaneous OT Goals Miscellaneous OT Goal #1: Patient will be educated on use of Hip Kit to increase functional performance during bathing and dressing tasks. OT Goal: Miscellaneous Goal #1 - Progress: Goal set today  Visit Information  Last OT Received On: 02/03/12 Assistance Needed: +1    Subjective Data  Subjective: "I'm feeling fine." Patient Stated Goal: To go home.   Prior Functioning  Vision/Perception  Home Living Lives With: Spouse Available Help at Discharge: Family Type of Home: House Home Access: Stairs to enter Secretary/administrator of Steps: 1 Entrance Stairs-Rails: None Home Layout: One level Bathroom Shower/Tub: Forensic scientist: Standard Home Adaptive Equipment:  Shower chair without back;Hand-held shower hose Prior Function Level of Independence: Independent Able to Take Stairs?: Yes Driving: Yes Vocation: Retired Musician: No difficulties;HOH Dominant Hand: Right   Vision -  Assessment Eye Alignment: Within Functional Limits  Cognition  Overall Cognitive Status: Appears within functional limits for tasks assessed/performed Arousal/Alertness: Awake/alert Orientation Level: Appears intact for tasks assessed Behavior During Session: [facility] for tasks performed    Extremity/Trunk Assessment Right Upper Extremity Assessment RUE ROM/Strength/Tone: Within functional levels RUE Coordination: WFL - gross/fine motor Left Upper Extremity Assessment LUE ROM/Strength/Tone: Within functional levels LUE Coordination: WFL - gross/fine motor   Mobility Bed Mobility Bed Mobility: Sit to Supine;Supine to Sit;Scooting to HOB Supine to Sit: 5: Supervision;HOB flat Sitting - Scoot to Edge of Bed: 5: Supervision Sit to Supine: 5: Supervision;HOB flat Scooting to HOB: 5: Supervision Details for Bed Mobility Assistance: Patient and wife asked about getting in and out of bed at home. Therapist provided education regarding bed mobility while following hip precautions at home. Transfers Transfers: Sit to Stand;Stand to Sit Sit to Stand: 5: Supervision;With upper extremity assist;From bed Stand to Sit: 5: Supervision;To bed;With upper extremity assist Details for Transfer Assistance: vc's for hand placement during transfer.         End of Session OT - End of Session Activity Tolerance: Patient tolerated treatment well Patient left: in bed;with call bell/phone within reach;with family/visitor present   Limmie Patricia, OTR/L 02/03/2012, 3:48 PM

## 2012-02-03 NOTE — Evaluation (Signed)
Physical Therapy Evaluation Patient Details Name: Malik Nicholson MRN: [id number] DOB: [birth year] Today's Date: 02/03/2012 Time: 7829-5621 PT Time Calculation (min): 71 min  PT Assessment / Plan / Recommendation Clinical Impression  Pt was seen for 1st post op PT visit.  Pain is well controlled.  He was instructed in anterior THR precautions.  Only min assist needed to transfer OOB (with HOB elevated) and he was able to ambulate 25' with PWB LLE, no significant pain.  He should progress rapidly and be able to transition to home at d/c.    PT Assessment  Patient needs continued PT services    Follow Up Recommendations  Home health PT    Barriers to Discharge None      Equipment Recommendations  Rolling walker with 5" wheels;3 in 1 bedside comode    Recommendations for Other Services     Frequency 7X/week    Precautions / Restrictions Precautions Precautions: Anterior Hip;Fall Precaution Booklet Issued: Yes (comment) Precaution Comments: pt did go to joint class...received booklet there Restrictions Weight Bearing Restrictions: No LLE Weight Bearing: Weight bearing as tolerated   Pertinent Vitals/Pain       Mobility  Bed Mobility Bed Mobility: Supine to Sit;Sit to Supine;Sitting - Scoot to Edge of Bed Supine to Sit: 4: Min assist;With rails;HOB elevated Sitting - Scoot to Edge of Bed: 5: Supervision Sit to Supine: Not Tested (comment) Transfers Transfers: Sit to Stand;Stand to Sit Sit to Stand: 4: Min guard;From elevated surface;With upper extremity assist;From bed Stand to Sit: To chair/3-in-1;With upper extremity assist;4: Min guard Ambulation/Gait Ambulation/Gait Assistance: 4: Min assist Ambulation Distance (Feet): 25 Feet Assistive device: Rolling walker Gait Pattern: Step-through pattern;Decreased stance time - left;Decreased stride length General Gait Details: an excellent gait pattern for the 1st day after surgery Stairs: No Wheelchair Mobility Wheelchair  Mobility: No    Exercises Total Joint Exercises Ankle Circles/Pumps: AROM;Both;10 reps;Supine Quad Sets: AROM;Both;10 reps;Supine Gluteal Sets: AROM;Both;10 reps;Supine Short Arc Quad: AAROM;Left;10 reps;Supine Hip ABduction/ADduction: AAROM;Left;10 reps;Supine   PT Diagnosis: Difficulty walking;Generalized weakness;Acute pain;Abnormality of gait  PT Problem List: Decreased strength;Decreased range of motion;Decreased activity tolerance;Decreased mobility;Decreased knowledge of use of DME;Decreased knowledge of precautions;Pain PT Treatment Interventions: DME instruction;Gait training;Stair training;Functional mobility training;Therapeutic exercise;Patient/family education   PT Goals Acute Rehab PT Goals PT Goal Formulation: With patient/family Time For Goal Achievement: 02/17/12 Potential to Achieve Goals: Good Pt will go Supine/Side to Sit: with supervision;with HOB not 0 degrees (comment degree) PT Goal: Supine/Side to Sit - Progress: Goal set today Pt will go Sit to Supine/Side: with supervision;with HOB not 0 degrees (comment degree) PT Goal: Sit to Supine/Side - Progress: Goal set today Pt will go Sit to Stand: with modified independence;with upper extremity assist PT Goal: Sit to Stand - Progress: Goal set today Pt will go Stand to Sit: with modified independence;with upper extremity assist PT Goal: Stand to Sit - Progress: Goal set today Pt will Ambulate: 16 - 50 feet;with supervision;with rolling walker PT Goal: Ambulate - Progress: Goal set today Pt will Go Up / Down Stairs: 1-2 stairs;with min assist;with rolling walker PT Goal: Up/Down Stairs - Progress: Goal set today  Visit Information  Last PT Received On: 02/03/12    Subjective Data  Subjective: I feel pretty good Patient Stated Goal: return home with no hip pain   Prior Functioning  Home Living Lives With: Spouse Available Help at Discharge: Family Type of Home: House Home Access: Stairs to enter [employer] of Steps: 1 Entrance Stairs-Rails: None  Home Layout: One level Bathroom Toilet: Standard Home Adaptive Equipment: Straight cane Prior Function Level of Independence: Independent Able to Take Stairs?: Yes Driving: Yes Vocation: Retired Musician: No difficulties    Cognition  Overall Cognitive Status: Appears within functional limits for tasks assessed/performed Arousal/Alertness: Awake/alert Orientation Level: Appears intact for tasks assessed Behavior During Session: [facility] for tasks performed    Extremity/Trunk Assessment Right Lower Extremity Assessment RLE ROM/Strength/Tone: Within functional levels RLE Sensation: WFL - Light Touch;WFL - Proprioception RLE Coordination: WFL - gross motor Left Lower Extremity Assessment LLE ROM/Strength/Tone: Deficits;Due to pain LLE ROM/Strength/Tone Deficits: strength generally 3-/5 LLE Sensation: WFL - Light Touch Trunk Assessment Trunk Assessment: Normal   Balance Balance Balance Assessed: No  End of Session PT - End of Session Equipment Utilized During Treatment: Gait belt Activity Tolerance: Patient tolerated treatment well Patient left: in chair;with call bell/phone within reach;with family/visitor present  GP     Malik Nicholson 02/03/2012, 9:46 AM

## 2012-02-03 NOTE — Progress Notes (Signed)
Physical Therapy Treatment Patient Details Name: Malik Nicholson MRN: [id number] DOB: [birth year] Today's Date: 02/03/2012 Time: 1130-1150 PT Time Calculation (min): 20 min  PT Assessment / Plan / Recommendation Comments on Treatment Session       Follow Up Recommendations  Home health PT    Barriers to Discharge None      Equipment Recommendations  Rolling walker with 5" wheels;3 in 1 bedside comode    Recommendations for Other Services    Frequency 7X/week   Plan      Precautions / Restrictions Precautions Precautions: Anterior Hip Precaution Booklet Issued: Yes (comment) Precaution Comments: pt did go to joint class...received booklet there Restrictions Weight Bearing Restrictions: No LLE Weight Bearing: Weight bearing as tolerated   Pertinent Vitals/Pain No pain    Mobility  Not performed pt did not want to go back to bed yet.   Exercises Total Joint Exercises Ankle Circles/Pumps: AROM;Both;10 reps Quad Sets: AROM;Both;10 reps Gluteal Sets: AROM;Both;10 reps Towel Squeeze: AROM;Both;10 reps Short Arc Quad: AROM;Left;10 reps Heel Slides: AROM;Both;10 reps Hip ABduction/ADduction: AAROM;Left;10 reps;Supine   PT Diagnosis: Difficulty walking;Generalized weakness;Acute pain;Abnormality of gait  PT Problem List: Decreased strength;Decreased range of motion;Decreased activity tolerance;Decreased mobility;Decreased knowledge of use of DME;Decreased knowledge of precautions;Pain PT Treatment Interventions: DME instruction;Gait training;Stair training;Functional mobility training;Therapeutic exercise;Patient/family education   PT Goals Acute Rehab PT Goals PT Goal Formulation: With patient/family Time For Goal Achievement: 02/17/12 Potential to Achieve Goals: Good Pt will go Supine/Side to Sit: with supervision;with HOB not 0 degrees (comment degree) PT Goal: Supine/Side to Sit - Progress: Goal set today Pt will go Sit to Supine/Side: with supervision;with HOB not 0  degrees (comment degree) PT Goal: Sit to Supine/Side - Progress: Goal set today Pt will go Sit to Stand: with modified independence;with upper extremity assist PT Goal: Sit to Stand - Progress: Goal set today Pt will go Stand to Sit: with modified independence;with upper extremity assist PT Goal: Stand to Sit - Progress: Goal set today Pt will Ambulate: 16 - 50 feet;with supervision;with rolling walker PT Goal: Ambulate - Progress: Goal set today Pt will Go Up / Down Stairs: 1-2 stairs;with min assist;with rolling walker PT Goal: Up/Down Stairs - Progress: Goal set today  Visit Information  Last PT Received On: 02/03/12 Reason Eval/Treat Not Completed:  (Pt seen for exercises not ready to go back to bed yet)    Subjective Data  Subjective: I don't have any pain. Patient Stated Goal: return home with no hip pain   Cognition  Overall Cognitive Status: Appears within functional limits for tasks assessed/performed Arousal/Alertness: Awake/alert Orientation Level: Appears intact for tasks assessed Behavior During Session: [facility] for tasks performed    Balance  Balance Balance Assessed: No  End of Session PT - End of Session Equipment Utilized During Treatment: Gait belt Activity Tolerance: Patient tolerated treatment well Patient left: in chair;with call bell/phone within reach   GP     RUSSELL,CINDY 02/03/2012, 11:59 AM

## 2012-02-04 LAB — TYPE AND SCREEN
Antibody Screen: NEGATIVE
Unit division: 0

## 2012-02-04 LAB — CBC
HCT: 30.7 % — ABNORMAL LOW (ref 39.0–52.0)
Hemoglobin: 10.2 g/dL — ABNORMAL LOW (ref 13.0–17.0)
WBC: 9 10*3/uL (ref 4.0–10.5)

## 2012-02-04 MED ORDER — OXYCODONE-ACETAMINOPHEN 5-325 MG PO TABS
1.0000 | ORAL_TABLET | ORAL | Status: DC
  Administered 2012-02-04 – 2012-02-05 (×5): 1 via ORAL
  Filled 2012-02-04 (×5): qty 1

## 2012-02-04 MED ORDER — OXYCODONE HCL 5 MG PO TABS
5.0000 mg | ORAL_TABLET | ORAL | Status: DC | PRN
  Administered 2012-02-04: 5 mg via ORAL
  Filled 2012-02-04: qty 1

## 2012-02-04 MED ORDER — BISACODYL 5 MG PO TBEC: 5.0000 mg | DELAYED_RELEASE_TABLET | Freq: Every day | ORAL | Status: AC | PRN

## 2012-02-04 MED ORDER — OXYCODONE-ACETAMINOPHEN 5-325 MG PO TABS: 1.0000 | ORAL_TABLET | ORAL | Status: AC

## 2012-02-04 MED ORDER — ENOXAPARIN SODIUM 40 MG/0.4ML ~~LOC~~ SOLN: 40.0000 mg | SUBCUTANEOUS | Status: DC

## 2012-02-04 MED ORDER — SENNOSIDES-DOCUSATE SODIUM 8.6-50 MG PO TABS: 1.0000 | ORAL_TABLET | Freq: Every evening | ORAL | Status: AC | PRN

## 2012-02-04 NOTE — Progress Notes (Signed)
Physical Therapy Treatment Patient Details Name: Malik Nicholson MRN: [id number] DOB: 08-16-1940 Today's Date: 02/04/2012 Time:  -     PT Assessment / Plan / Recommendation Comments on Treatment Session  Pt has met all acute care PT goals    Follow Up Recommendations       Barriers to Discharge        Equipment Recommendations       Recommendations for Other Services    Frequency     Plan Discharge plan remains appropriate    Precautions / Restrictions     Pertinent Vitals/Pain     Mobility  Transfers Sit to Stand: 6: Modified independent (Device/Increase time) Stand to Sit: 6: Modified independent (Device/Increase time) Ambulation/Gait Ambulation/Gait Assistance: 6: Modified independent (Device/Increase time) Ambulation Distance (Feet): 200 Feet Assistive device: Rolling walker Gait Pattern: Within Functional Limits Stairs: Yes Stairs Assistance: 6: Modified independent (Device/Increase time) Stair Management Technique: No rails;Backwards;Forwards Number of Stairs: 1  Wheelchair Mobility Wheelchair Mobility: No    Exercises Total Joint Exercises Ankle Circles/Pumps: AROM;Both;10 reps;Seated Quad Sets: AROM;Both;10 reps;Seated Gluteal Sets: AROM;Both;10 reps;Seated Towel Squeeze: AROM;Both;10 reps;Seated Short Arc Quad: AROM;Both;10 reps;Seated Heel Slides: AAROM;Left;10 reps;Seated Long Arc Quad: AROM;Left;10 reps;Seated   PT Diagnosis:    PT Problem List:   PT Treatment Interventions:     PT Goals Acute Rehab PT Goals PT Goal: Sit to Stand - Progress: Met PT Goal: Up/Down Stairs - Progress: Met  Visit Information  Last PT Received On: 02/04/12    Subjective Data  Subjective: I've enjoyed sitting up   Cognition       Balance     End of Session PT - End of Session Equipment Utilized During Treatment: Gait belt Activity Tolerance: Patient tolerated treatment well Patient left: in chair;with call bell/phone within reach;with chair alarm  set Nurse Communication: Mobility status   GP     Konrad Penta 02/04/2012, 1:33 PM

## 2012-02-04 NOTE — Progress Notes (Signed)
Subjective: 2 Days Post-Op Procedure(s) (LRB): TOTAL HIP ARTHROPLASTY (Left) Patient reports pain as mild.    Objective: Vital signs in last 24 hours: Temp:  [97.5 F (36.4 C)-98.4 F (36.9 C)] 97.5 F (36.4 C) (08/14 0603) Pulse Rate:  [80-101] 101  (08/14 0603) Resp:  [20] 20  (08/14 0603) BP: (109-115)/(66-72) 114/66 mmHg (08/14 0603) SpO2:  [95 %-100 %] 95 % (08/14 0603)  Intake/Output from previous day: 08/13 0701 - 08/14 0700 In: 1671.7 [P.O.:900; I.V.:771.7] Out: 800 [Urine:800] Intake/Output this shift:     Basename 02/04/12 0450 02/03/12 0447  HGB 10.2* 10.8*    Basename 02/04/12 0450 02/03/12 0447  WBC 9.0 7.5  RBC 3.45* 3.66*  HCT 30.7* 33.0*  PLT 210 195    Basename 02/03/12 0447  NA 135  K 5.0  CL 101  CO2 25  BUN 13  CREATININE 0.92  GLUCOSE 131*  CALCIUM 8.8   No results found for this basename: LABPT:2,INR:2 in the last 72 hours  Neurologically intact Neurovascular intact Sensation intact distally Intact pulses distally Dorsiflexion/Plantar flexion intact Incision: scant drainage  Assessment/Plan: 2 Days Post-Op Procedure(s) (LRB): TOTAL HIP ARTHROPLASTY (Left) Discharge home with home health tomorrow  Fuller Canada 02/04/2012, 7:50 AM

## 2012-02-04 NOTE — Discharge Summary (Addendum)
Physician Discharge Summary  Patient ID: Malik Nicholson MRN: [id number] DOB/AGE: 1941-05-03 71 y.o.  Admit date: 02/02/2012 Discharge date: 02/04/2012  Admission Diagnoses: OA LEFT HIP   Discharge Diagnoses: OA LEFT HIP Active Problems:  * No active hospital problems. 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 He tolerated the procedure well had an uncomplicated hospital course. He was able him bili 200 feet he was modified assist out of bed sit to stand.  Consults: None  Significant Diagnostic Studies: labs:  Lab Results  Component Value Date   WBC 9.2 02/05/2012   HGB 9.7* 02/05/2012   HCT 29.5* 02/05/2012   MCV 88.6 02/05/2012   PLT 228 02/05/2012   BMET    Component Value Date/Time   NA 135 02/03/2012 0447   K 5.0 02/03/2012 0447   CL 101 02/03/2012 0447   CO2 25 02/03/2012 0447   GLUCOSE 131* 02/03/2012 0447   BUN 13 02/03/2012 0447   CREATININE 0.92 02/03/2012 0447   CALCIUM 8.8 02/03/2012 0447   GFRNONAA 82* 02/03/2012 0447   GFRAA >90 02/03/2012 0447      Treatments: surgery: Left total hip arthroplasty with depuy Tri-Lock hip stem size 10, size 56 cup, size +1.5 neck with 36 femoral head  Discharge Exam: Blood pressure 122/73, pulse 82, temperature 97.7 F (36.5 C), temperature source Oral, resp. rate 20, height 5\' 11"  (1.803 m), weight 215 lb 9.8 oz (97.8 kg), SpO2 99.00%. Incision/Wound: clean  Disposition:   Discharge Orders    Future Appointments: Provider: Department: Dept Phone: Center:   02/16/2012 1:45 PM Vickki Hearing, MD [facility] [phone] [facility]     Future Orders Please Complete By Expires   Diet - low sodium heart healthy      Call MD / Call 911      Comments:   If you experience chest pain or shortness of breath, CALL 911 and be transported to the hospital emergency room.  If you develope a fever above 101 F, pus (white  drainage) or increased drainage or redness at the wound, or calf pain, call your surgeon's office.   Constipation Prevention      Comments:   Drink plenty of fluids.  Prune juice may be helpful.  You may use a stool softener, such as Colace (over the counter) 100 mg twice a day.  Use MiraLax (over the counter) for constipation as needed.   Increase activity slowly as tolerated      Follow the hip precautions as taught in Physical Therapy      Change dressing      Comments:   You may change your dressing daily, then change the dressing daily with sterile 4 x 4 inch gauze dressing and paper tape.  You may clean the incision with alcohol prior to redressing   TED hose      Comments:   Use stockings (TED hose) for 6 weeks on both leg(s).  You may remove them at night for sleeping.   Driving restrictions      Comments:   No driving for 3 weeks   Home Health      Questions: Responses:   To provide the following care/treatments PT    OT    RN   Face-to-face encounter      Comments:   I Fuller Canada certify that this patient is under my care and that  I, or a nurse practitioner or physician's assistant working with me, had a face-to-face encounter that meets the physician face-to-face encounter requirements with this patient on 02/04/2012.   Questions: Responses:   The encounter with the patient was in whole, or in part, for the following medical condition, which is the primary reason for home health care hip oa   I certify that, based on my findings, the following services are medically necessary home health services Nursing    Physical therapy   My clinical findings support the need for the above services Post Hip Replacement with limited ambulation and strength   Further, I certify that my clinical findings support that this patient is homebound due to: Ambulates short distances less than 300 feet   To provide the following care/treatments PT    OT    RN     Medication List  As of  02/04/2012 11:22 PM   TAKE these medications         atorvastatin 10 MG tablet   Commonly known as: LIPITOR   Take 10 mg by mouth daily.      bisacodyl 5 MG EC tablet   Commonly known as: DULCOLAX   Take 1 tablet (5 mg total) by mouth daily as needed.      enoxaparin 40 MG/0.4ML injection   Commonly known as: LOVENOX   Inject 0.4 mLs (40 mg total) into the skin daily.      gabapentin 100 MG capsule   Commonly known as: NEURONTIN   Take 1 capsule (100 mg total) by mouth 3 (three) times daily.      glucosamine-chondroitin 500-400 MG tablet   Take 2 tablets by mouth 2 (two) times daily.      losartan 50 MG tablet   Commonly known as: COZAAR   Take 50 mg by mouth daily.      oxyCODONE-acetaminophen 5-325 MG per tablet   Commonly known as: PERCOCET/ROXICET   Take 1 tablet by mouth every 4 (four) hours.      senna-docusate 8.6-50 MG per tablet   Commonly known as: Senokot-S   Take 1 tablet by mouth at bedtime as needed.           Follow-up Information    Follow up with Fuller Canada, MD. Schedule an appointment as soon as possible for a visit on 02/16/2012.   Contact information:   [address], [address] [place] [address] [phone]          Signed: Fuller Canada 02/04/2012, 11:22 PM

## 2012-02-04 NOTE — Progress Notes (Signed)
Physical Therapy Treatment Patient Details Name: Malik Nicholson MRN: [id number] DOB: Dec 12, 1940 Today's Date: 02/04/2012 Time: 4098-1191 PT Time Calculation (min): 43 min  PT Assessment / Plan / Recommendation Comments on Treatment Session  Pt progressing very well.  Should be ready for discharge to home tomorrow.    Follow Up Recommendations       Barriers to Discharge        Equipment Recommendations  3 in 1 bedside comode (pt has borrowed a walker from a friend)    Recommendations for Other Services    Frequency     Plan Discharge plan remains appropriate;Frequency remains appropriate;Equipment recommendations need to be updated    Precautions / Restrictions     Pertinent Vitals/Pain     Mobility  Bed Mobility Supine to Sit: 5: Supervision;HOB flat Sitting - Scoot to Edge of Bed: 7: Independent Details for Bed Mobility Assistance: reviewed procedure for OOB at home, exiting from L side of bed as it is at home Transfers Sit to Stand: 6: Modified independent (Device/Increase time);With upper extremity assist;From bed Stand to Sit: 6: Modified independent (Device/Increase time);To chair/3-in-1;With upper extremity assist Ambulation/Gait Ambulation/Gait Assistance: 5: Supervision Ambulation Distance (Feet): 150 Feet Assistive device: Rolling walker Gait Pattern: Step-through pattern;Decreased stance time - left    Exercises Total Joint Exercises Ankle Circles/Pumps: AROM;Both;10 reps;Supine Quad Sets: AROM;Both;10 reps;Supine Gluteal Sets: AROM;Both;10 reps;Supine Short Arc Quad: AAROM;Both;10 reps;Supine Heel Slides: AAROM;Both;10 reps;Supine Hip ABduction/ADduction:  (this exercise was not done..placed in error)   PT Diagnosis:    PT Problem List:   PT Treatment Interventions:     PT Goals Acute Rehab PT Goals Pt will go Supine/Side to Sit: with modified independence PT Goal: Supine/Side to Sit - Progress: Updated due to goal met PT Goal: Sit to Stand -  Progress: Met PT Goal: Stand to Sit - Progress: Met PT Goal: Ambulate - Progress: Met  Visit Information  Last PT Received On: 02/04/12    Subjective Data  Subjective: I feel good   Cognition       Balance     End of Session PT - End of Session Equipment Utilized During Treatment: Gait belt Activity Tolerance: Patient tolerated treatment well Patient left: in bed;with call bell/phone within reach;with family/visitor present Nurse Communication: Mobility status   GP     Konrad Penta 02/04/2012, 9:20 AM

## 2012-02-04 NOTE — Progress Notes (Signed)
Occupational Therapy Treatment Patient Details Name: Malik Nicholson MRN: [id number] DOB: March 09, 1941 Today's Date: 02/04/2012 Time: 4098-1191 OT Time Calculation (min): 10 min  OT Assessment / Plan / Recommendation Comments on Treatment Session Pt and wife educated on hip kit equipment and use. Pt returned demo. Informed pt of where to purchase kit if interested.          Equipment Recommendations  Other (comment) (hip kit)          Plan Discharge plan remains appropriate    Precautions / Restrictions   Total Hip, Fall  Pertinent Vitals/Pain No report of pain.    ADL  Lower Body Bathing: Simulated;Modified independent;Other (comment) (with long handled sponge demo) Where Assessed - Lower Body Bathing: Supported sit to stand Lower Body Dressing: Performed;Set up (with reacher and sock aid) Where Assessed - Lower Body Dressing: Supported sit to stand Equipment Used: Sock aid;Reacher;Long-handled sponge ADL Comments: Patient and wife was educated on hip kit equipment. Therapist provided demonstration. Pt returned demo.      OT Goals ADL Goals ADL Goal: Lower Body Bathing - Progress: Met ADL Goal: Lower Body Dressing - Progress: Met Miscellaneous OT Goals OT Goal: Miscellaneous Goal #1 - Progress: Met  Visit Information  Last OT Received On: 02/04/12 Assistance Needed: +1    Subjective Data  Subjective: "I'm going home tomorrow." Patient Stated Goal: To go home.                  End of Session OT - End of Session Activity Tolerance: Patient tolerated treatment well Patient left: in chair;with family/visitor present;with call bell/phone within reach    [facility], OTR/L 02/04/2012, 3:01 PM

## 2012-02-05 LAB — CBC
Hemoglobin: 9.7 g/dL — ABNORMAL LOW (ref 13.0–17.0)
MCH: 29.1 pg (ref 26.0–34.0)
MCV: 88.6 fL (ref 78.0–100.0)
RBC: 3.33 MIL/uL — ABNORMAL LOW (ref 4.22–5.81)

## 2012-02-05 MED ORDER — ENOXAPARIN (LOVENOX) PATIENT EDUCATION KIT
PACK | Freq: Once | Status: DC
  Filled 2012-02-05: qty 1

## 2012-02-05 NOTE — Progress Notes (Signed)
Discharge instructions given on medications ,and follow up visits,patient verbalized understanding.Prescription given to patient,and the other medications will picked up from the pharmacy. Accompanied by staff to an awaiting vehicle.

## 2012-02-05 NOTE — Plan of Care (Signed)
Problem: Discharge Progression Outcomes Goal: Other Discharge Outcomes/Goals Patient discharged home with Lovenox instructions given on medication,and self administering,patient verbalized understanding,and return with demonstration of given his own Lovenox injection this am,he did well.

## 2012-02-05 NOTE — Progress Notes (Signed)
Patient educated on Lovenox,instructions were given on self injection,patient viewed Lovenox video,verbalized understanding. Lovenox kit also given to patient.

## 2012-02-06 ENCOUNTER — Other Ambulatory Visit: Payer: Self-pay | Admitting: Orthopedic Surgery

## 2012-02-06 ENCOUNTER — Telehealth: Payer: Self-pay | Admitting: Orthopedic Surgery

## 2012-02-06 DIAGNOSIS — Z96649 Presence of unspecified artificial hip joint: Secondary | ICD-10-CM

## 2012-02-06 MED ORDER — HYDROCODONE-ACETAMINOPHEN 10-325 MG PO TABS: 1.0000 | ORAL_TABLET | Freq: Four times a day (QID) | ORAL | Status: AC | PRN

## 2012-02-06 NOTE — Telephone Encounter (Signed)
Patient as well as [facility] nurse Victorino Dike have called to relay that patient does not seem to be able to find the prescription for his pain medication, given to him at time of hospital discharge, following total hip surgery (date of procedure 02/02/12).  [facility] is [facility], ph# [phone], fax# [phone].   Clinical staff already out of office for today, until Monday.   Rx for the following medication is indicated in medication list in chart:  Oxycodone-acetaminophen (Percocet/Roxicet) 5-325mg ; dose 1 tablet every 4 hours, quantity 84 tablets, per order placed 02/04/12, 2320. Please advise.

## 2012-02-09 ENCOUNTER — Other Ambulatory Visit: Payer: Self-pay | Admitting: *Deleted

## 2012-02-09 ENCOUNTER — Telehealth: Payer: Self-pay | Admitting: Orthopedic Surgery

## 2012-02-09 DIAGNOSIS — Z96649 Presence of unspecified artificial hip joint: Secondary | ICD-10-CM

## 2012-02-09 MED FILL — Bupivacaine Inj 0.5% w/ Epinephrine 1:200000 (PF): INTRAMUSCULAR | Qty: 30 | Status: AC

## 2012-02-09 NOTE — Telephone Encounter (Signed)
[facility] Nurses left a message 02/06/12 asking if a bedside commode or a 3 in 1 was ordered for Lamonte Richer. Her # [phone]

## 2012-02-09 NOTE — Telephone Encounter (Signed)
HH nurse aware

## 2012-02-09 NOTE — Telephone Encounter (Signed)
Orders sent to [facility]

## 2012-02-10 ENCOUNTER — Telehealth: Payer: Self-pay | Admitting: Orthopedic Surgery

## 2012-02-10 ENCOUNTER — Encounter (HOSPITAL_COMMUNITY): Payer: Self-pay | Admitting: Orthopedic Surgery

## 2012-02-10 NOTE — Telephone Encounter (Signed)
Called patient, left voicemail.

## 2012-02-10 NOTE — Telephone Encounter (Signed)
Call received from [facility] from [facility] in [place], ph# [phone], Ext [phone]; states that she needs verification from our office for order of supplies (guaze, dressing supplies) per [facility].  I requested a faxed copy of orders for Dr. Romeo Apple.  She confirmed our fax# - no order yet received.  I have called back to inquire about status of faxed order - received voice message.  Please call to verify or please advise.

## 2012-02-11 NOTE — Telephone Encounter (Signed)
02/11/12 Received order via fax from [facility]; signed by Dr. Romeo Apple and faxed to [phone].

## 2012-02-12 ENCOUNTER — Telehealth: Payer: Self-pay | Admitting: Orthopedic Surgery

## 2012-02-12 NOTE — Telephone Encounter (Signed)
(  02/11/12 afternoon) Denny Peon, physical therapist at [facility], called for clarification regarding which type of approach was used in patient's recent total hip replacement surgery, anterior or posterior?  Said she has been out to see patient two days this week, however, the therapy at hospital was initiated one way(more for anterior), and the initial home therapy by the first [facility] therapist, did another(more for posterior). Therefore, before any further therapy is done, she needs this information. Please call main office # [phone].

## 2012-02-16 ENCOUNTER — Encounter: Payer: Self-pay | Admitting: Orthopedic Surgery

## 2012-02-16 ENCOUNTER — Ambulatory Visit (INDEPENDENT_AMBULATORY_CARE_PROVIDER_SITE_OTHER): Payer: Medicare Other | Admitting: Orthopedic Surgery

## 2012-02-16 VITALS — Ht 71.0 in | Wt 215.0 lb

## 2012-02-16 DIAGNOSIS — Z96649 Presence of unspecified artificial hip joint: Secondary | ICD-10-CM

## 2012-02-16 NOTE — Patient Instructions (Addendum)
Start outpatient therapy   Stop home therapy

## 2012-02-16 NOTE — Progress Notes (Signed)
Patient ID: Malik Nicholson, male   DOB: 08/13/1940, 71 y.o.   MRN: [id number] Chief Complaint  Patient presents with  . Routine Post Op    post op 1, Left THA, DOS 02/02/12    Ht 5\' 11"  (1.803 m)  Wt 215 lb (97.523 kg)  BMI 29.99 kg/m2  Post op day 14 Left tha   Doing well   Clean wound   Start OP PT   Ret 4 weeks

## 2012-02-18 ENCOUNTER — Ambulatory Visit (HOSPITAL_COMMUNITY)
Admission: RE | Admit: 2012-02-18 | Discharge: 2012-02-18 | Disposition: A | Discharge status: Home / Self Care | Payer: Medicare Other | Source: Ambulatory Visit | Attending: Orthopedic Surgery | Admitting: Orthopedic Surgery

## 2012-02-18 DIAGNOSIS — IMO0001 Reserved for inherently not codable concepts without codable children: Secondary | ICD-10-CM | POA: Insufficient documentation

## 2012-02-18 DIAGNOSIS — M1610 Unilateral primary osteoarthritis, unspecified hip: Secondary | ICD-10-CM | POA: Diagnosis present

## 2012-02-18 DIAGNOSIS — R262 Difficulty in walking, not elsewhere classified: Secondary | ICD-10-CM | POA: Diagnosis present

## 2012-02-18 DIAGNOSIS — R279 Unspecified lack of coordination: Secondary | ICD-10-CM | POA: Insufficient documentation

## 2012-02-18 DIAGNOSIS — M169 Osteoarthritis of hip, unspecified: Secondary | ICD-10-CM | POA: Diagnosis present

## 2012-02-18 DIAGNOSIS — M25559 Pain in unspecified hip: Secondary | ICD-10-CM | POA: Insufficient documentation

## 2012-02-18 DIAGNOSIS — M25659 Stiffness of unspecified hip, not elsewhere classified: Secondary | ICD-10-CM | POA: Insufficient documentation

## 2012-02-18 DIAGNOSIS — M6281 Muscle weakness (generalized): Secondary | ICD-10-CM | POA: Diagnosis present

## 2012-02-18 DIAGNOSIS — Z96649 Presence of unspecified artificial hip joint: Secondary | ICD-10-CM

## 2012-02-18 NOTE — Evaluation (Addendum)
Physical Therapy Evaluation  Patient Details  Name: Malik Nicholson MRN: [id number] Date of Birth: 07-Apr-1941  Today's Date: 02/18/2012 Time: 4098-1191 PT Time Calculation (min): 43 min Charges: 1 eval  Visit#: 1  of 10   Re-eval: 03/19/12 Assessment Diagnosis: L THR Surgical Date: 02/02/12 Next MD Visit: 03/07/12 Prior Therapy: HHPT - 3 visits total  Authorization: [facility] Medicare  Authorization Time Period:    Authorization Visit#: 1  of 10    Past Medical History:  Past Medical History  Diagnosis Date  . High blood pressure   . Sleep apnea     stop bangcock score of 4   Past Surgical History:  Past Surgical History  Procedure Date  . Shoulder surgery   . Hernia repair   . Total hip arthroplasty 02/02/2012    Procedure: TOTAL HIP ARTHROPLASTY;  Surgeon: Vickki Hearing, MD;  Location: [facility];  Service: Orthopedics;  Laterality: Left;    Subjective Symptoms/Limitations Symptoms: PMH: Osteoporosis, HTN, sleep apnea.  Medication for pain.  Pertinent History: Pt is referred to PT s/p R THR on 02/02/12 from a history of OA/OP.  He is currently ambulating in outdoor environment with his RW and explains that he thought he was suppose to use his RW in PT.  He reports that he has been using his SPC in his house.  His c/co's are decreased LE strength, difficulty walking, impaired gait mechanics and general hip soreness.   Patient Stated Goals: "I want to walk without the walker and use a SPC like I used to" Pain Assessment Currently in Pain?: Yes Pain Score:   3 Pain Location: Hip Pain Orientation: Left Pain Type: Acute pain;Surgical pain  Precautions/Restrictions  Precautions Precautions: Lateral Hip Restrictions LLE Weight Bearing: Weight bearing as tolerated  Prior Functioning  Home Living Lives With: Spouse Available Help at Discharge: Family Home Access: Stairs to enter Secretary/administrator of Steps: 1 Home Layout: One  level  Cognition/Observation Observation/Other Assessments Observations: 25 degree extension lag w/SLR Other Assessments: Favors LLE with standing.  Moderate rounded shoulders, has appropriate posture when standing up straight  Sensation/Coordination/Flexibility/Functional Tests Coordination Gross Motor Movements are Fluid and Coordinated: No Coordination and Movement Description: impaired coordination with functional squat Functional Tests Functional Tests: ABC - 73. Functional Tests: 30 sec STS: 3x complete  Assessment RLE Strength Right Hip Flexion: 4/5 Right Knee Flexion: 5/5 Right Knee Extension: 5/5 LLE Strength Left Hip Flexion:  (4+/5) Left Hip Extension: 2+/5 Left Knee Flexion: 4/5 Left Knee Extension: 5/5  Mobility/Balance  Ambulation/Gait Ambulation/Gait Assistance: 6: Modified independent (Device/Increase time) Assistive device: Rolling walker;Straight cane Gait Pattern: Decreased hip/knee flexion - left;Decreased stance time - left;Left foot flat;Trunk flexed Static Standing Balance Single Leg Stance - Right Leg: 3  Single Leg Stance - Left Leg: 0  Tandem Stance - Right Leg: 10  Tandem Stance - Left Leg: 10  Rhomberg - Eyes Opened: 10  Rhomberg - Eyes Closed: 10  Timed Up and Go Test TUG: Normal TUG Normal TUG (seconds): 25  (rw)   Exercise/Treatments Standing Heel Raises: 10 reps;Limitations (HEP) Heel Raises Limitations: Toe Raises 10 reps (HEP) Functional Squat: 10 reps;Limitations (HEP) Functional Squat Limitations: max VC's and TC's for proper coordinated movement Supine Quad Sets: Left;5 reps;Limitations (HEP) Quad Sets Limitations: 10 sec hold Short Arc Quad Sets: Left;5 reps;Limitations (HEP) Short Arc Quad Sets Limitations: 5 sec hold Straight Leg Raises: AAROM;Left;10 reps (HEP)  Physical Therapy Assessment and Plan PT Assessment and Plan Clinical Impression Statement: Pt is  referred to PT s/p L THR.  At the end of session today was able  to demonstrate gait w/SPC w/min guard A with impaired posture and gait mechaincs.  Pt will benefit from skilled therapeutic intervention in order to improve on the following deficits: Abnormal gait;Difficulty walking;Pain;Decreased strength;Decreased range of motion;Decreased coordination;Impaired perceived functional ability Rehab Potential: Good PT Frequency: Min 3X/week PT Duration: 6 weeks PT Treatment/Interventions: DME instruction;Gait training;Stair training;Functional mobility training;Therapeutic activities;Therapeutic exercise;Neuromuscular re-education;Balance training;Patient/family education;Modalities PT Plan: HIP PERCAUTIONS .  LE strengthening and gait/balance activities to improve strength and function to move to [facility].     Goals Home Exercise Program Pt will Perform Home Exercise Program: Independently PT Goal: Perform Home Exercise Program - Progress: Goal set today PT Short Term Goals Time to Complete Short Term Goals: 3 weeks PT Short Term Goal 1: Pt will report pain less than 2/67for 75% of his day to his L hip.  PT Short Term Goal 2: Pt will ambulate x10 minutes w/SPC indoor and outdoor enviroments w/supervision.  PT Short Term Goal 3: Pt will improve LE strength by 1 muscle grade and demonstrate appropriate L SLR without extension lag.  PT Short Term Goal 4: Pt will improve LE coordination and perform a funcitonal squat without favoring LLE.  PT Long Term Goals Time to Complete Long Term Goals: Other (comment) (6 weeks. ) PT Long Term Goal 1: Pt will improve his TUG time with a SPC to less than or equal to 13 seconds for improved safey with ambulating in the community.  PT Long Term Goal 2: Pt will improve LE functional strength and demonstrate 5 STS in 30 seconds and ascend and descend 5 stairs w/1 handrail and supervision in order to enter community dwellings.  Long Term Goal 3: Pt will improve his ABC to 80% for improved percieved funcitonal ability with balance.    Problem List Patient Active Problem List  Diagnosis  . ANEMIA, NORMOCYTIC  . OSTEOARTHRITIS, HIP, LEFT  . OSTEOARTHRITIS, KNEE, LEFT  . JOINT EFFUSION, LEFT KNEE  . DEGENERATIVE DISC DISEASE, LUMBAR SPINE  . DYSPHAGIA UNSPECIFIED  . Mononeuritis leg  . DDD (degenerative disc disease)  . Osteoarthritis of hip  . DDD (degenerative disc disease), lumbosacral  . Spinal stenosis  . OA (osteoarthritis) of hip  . S/P total hip arthroplasty  . Difficulty in walking  . Muscle weakness (generalized)    PT - End of Session Equipment Utilized During Treatment: Gait belt Activity Tolerance: Patient tolerated treatment well General Behavior During Session: [facility] for tasks performed Cognition: [facility] for tasks performed PT Plan of Care PT Home Exercise Plan: see scanned report PT Patient Instructions: Importance of HEP and completeing correctly. Consulted and Agree with Plan of Care: Patient;Family member/caregiver Family Member Consulted: wife Teacher, music)  GP Functional Assessment Tool Used: Based on ABC score (73% confident (27% impairment)) Functional Limitation: Mobility: Walking and moving around Mobility: Walking and Moving Around Current Status [phone]): At least 20 percent but less than 40 percent impaired, limited or restricted Mobility: Walking and Moving Around Goal Status [phone]): At least 1 percent but less than 20 percent impaired, limited or restricted  Paz Fuentes, PT 02/18/2012, 11:46 AM  Physician Documentation Your signature is required to indicate approval of the treatment plan as stated above.  Please sign and either send electronically or make a copy of this report for your files and return this physician signed original.   Please mark one 1.__approve of plan  2. ___approve of plan with the following  conditions.   ______________________________                                                          _____________________ Physician Signature                                                                                                              Date

## 2012-02-20 ENCOUNTER — Ambulatory Visit (HOSPITAL_COMMUNITY)
Admission: RE | Admit: 2012-02-20 | Discharge: 2012-02-20 | Disposition: A | Discharge status: Home / Self Care | Payer: Medicare Other | Source: Ambulatory Visit | Attending: Pulmonary Disease | Admitting: Pulmonary Disease

## 2012-02-20 NOTE — Progress Notes (Signed)
Physical Therapy Treatment Patient Details  Name: Malik Nicholson MRN: [id number] Date of Birth: January 23, 1941  Today's Date: 02/20/2012 Time: 0932-1015 PT Time Calculation (min): 43 min  Visit#: 2  of 10   Re-eval: 03/19/12  Charge: gait 20 therex 23  Authorization: [employer] Medicare  Authorization Time Period:    Authorization Visit#: 2  of 10    Subjective: Symptoms/Limitations Symptoms: Pt reported L hip soreness.  Pt did state compliance with HEP, no questions concerning exercises. Pain Assessment Currently in Pain?: Yes Pain Score:   2 Pain Location: Hip Pain Orientation: Left  Objective:   Exercise/Treatments Aerobic Tread Mill: Gait trainer at .55 cyc/sec x 6 min following gait training  Standing Heel Raises: 20 reps;Limitations Heel Raises Limitations: Toe Raises Functional Squat: 15 reps;Limitations Functional Squat Limitations: max VC's and TC's for proper coordinated movement Gait Training: 500 ft with SPC Supine Quad Sets: 2 sets;10 reps;Limitations Quad Sets Limitations: 10 sec holds Short Arc [employer] Sets: 15 reps;Limitations Short Arc Quad Sets Limitations: 10 sec holds Bridges: 15 reps;Limitations Bridges Limitations: 5 sec holds Straight Leg Raises: Left;10 reps    Physical Therapy Assessment and Plan PT Assessment and Plan Clinical Impression Statement: Began treatment per PT POC for improved gait mechanics and L LE strenthening/knee ROM.  Pt able to demonstrate appropaite mechanics with SPC following cueing for heel to toe, posture and to increase stride length for more normalized gait.  Pt explained importance with gait with knee extension to improve gait mechanics.   PT Plan: HIP PERCAUTIONS . LE strengthening and gait/balance activities to improve strength and function to move to [facility].  Begin TKE next session.    Goals    Problem List Patient Active Problem List  Diagnosis  . ANEMIA, NORMOCYTIC  . OSTEOARTHRITIS, HIP, LEFT  . OSTEOARTHRITIS,  KNEE, LEFT  . JOINT EFFUSION, LEFT KNEE  . DEGENERATIVE DISC DISEASE, LUMBAR SPINE  . DYSPHAGIA UNSPECIFIED  . Mononeuritis leg  . DDD (degenerative disc disease)  . Osteoarthritis of hip  . DDD (degenerative disc disease), lumbosacral  . Spinal stenosis  . OA (osteoarthritis) of hip  . S/P total hip arthroplasty  . Difficulty in walking  . Muscle weakness (generalized)    PT - End of Session Equipment Utilized During Treatment: Gait belt Activity Tolerance: Patient tolerated treatment well General Behavior During Session: [facility] for tasks performed Cognition: [facility] for tasks performed  GP    Juel Burrow 02/20/2012, 11:29 AM

## 2012-02-24 ENCOUNTER — Ambulatory Visit (HOSPITAL_COMMUNITY)
Admission: RE | Admit: 2012-02-24 | Discharge: 2012-02-24 | Disposition: A | Discharge status: Home / Self Care | Payer: Medicare Other | Source: Ambulatory Visit | Attending: Pulmonary Disease | Admitting: Pulmonary Disease

## 2012-02-24 DIAGNOSIS — M6281 Muscle weakness (generalized): Secondary | ICD-10-CM | POA: Insufficient documentation

## 2012-02-24 DIAGNOSIS — M25659 Stiffness of unspecified hip, not elsewhere classified: Secondary | ICD-10-CM | POA: Insufficient documentation

## 2012-02-24 DIAGNOSIS — R279 Unspecified lack of coordination: Secondary | ICD-10-CM | POA: Insufficient documentation

## 2012-02-24 DIAGNOSIS — R262 Difficulty in walking, not elsewhere classified: Secondary | ICD-10-CM | POA: Insufficient documentation

## 2012-02-24 DIAGNOSIS — M25559 Pain in unspecified hip: Secondary | ICD-10-CM | POA: Insufficient documentation

## 2012-02-24 DIAGNOSIS — IMO0001 Reserved for inherently not codable concepts without codable children: Secondary | ICD-10-CM | POA: Insufficient documentation

## 2012-02-24 NOTE — Progress Notes (Signed)
Physical Therapy Treatment Patient Details  Name: Malik Nicholson MRN: [id number] Date of Birth: [birth year]  Today's Date: 02/24/2012 Time: 0932-1015 PT Time Calculation (min): 43 min  Visit#: 3  of 10   Re-eval: 03/19/12  Charge: Gait 12', therex 30'  Authorization: [employer] Medicare  Authorization Time Period:    Authorization Visit#: 3  of 10    Subjective: Symptoms/Limitations Symptoms: L hip is mainly sore today, maybe a 5/10 pain scale. Pain Assessment Currently in Pain?: Yes Pain Score:   5 Pain Location: Hip Pain Orientation: Left  Objective:   Exercise/Treatments Aerobic Tread Mill: Gait trainer at .58 cyc/sec x 8 mn with cueing to increase stride length and for posture Standing Heel Raises: 20 reps;Limitations Heel Raises Limitations: Toe Raises Functional Squat: 15 reps;Limitations Functional Squat Limitations: max VC's and TC's for proper coordinated movement Rocker Board: 2 minutes;Limitations Rocker Board Limitations: R/L with HHA Gait Training: SPC Seated Other Seated Knee Exercises: Heel roll out 10x 10" Supine Quad Sets: 2 sets;10 reps;Limitations Quad Sets Limitations: 10 sec holds Short Arc [employer] Sets: Limitations;20 reps Water quality scientist Limitations: 10 sec holds Terminal Knee Extension: 15 reps;Limitations Terminal Knee Extension Limitations: 10" holds Bridges: 20 reps;Limitations Bridges Limitations: 5 sec holds with orange ball Straight Leg Raises: AAROM;Left;15 reps    Physical Therapy Assessment and Plan PT Assessment and Plan Clinical Impression Statement: Max cueing to increase stride length and for posture with gait today.  Pt able to demonstrate proper mechanics with SPC following cueing.  Added TKE to POC to improve standing knee extension with gait.   PT Plan: Follow hip precautions.  Continue with LE strengthening and gait mechanics.  Begin tandem stance next session to improve balance.    Goals    Problem List Patient Active  Problem List  Diagnosis  . ANEMIA, NORMOCYTIC  . OSTEOARTHRITIS, HIP, LEFT  . OSTEOARTHRITIS, KNEE, LEFT  . JOINT EFFUSION, LEFT KNEE  . DEGENERATIVE DISC DISEASE, LUMBAR SPINE  . DYSPHAGIA UNSPECIFIED  . Mononeuritis leg  . DDD (degenerative disc disease)  . Osteoarthritis of hip  . DDD (degenerative disc disease), lumbosacral  . Spinal stenosis  . OA (osteoarthritis) of hip  . S/P total hip arthroplasty  . Difficulty in walking  . Muscle weakness (generalized)    PT - End of Session Equipment Utilized During Treatment: Gait belt Activity Tolerance: Patient tolerated treatment well General Behavior During Session: [facility] for tasks performed Cognition: [facility] for tasks performed  GP    Juel Burrow 02/24/2012, 11:33 AM

## 2012-02-25 ENCOUNTER — Ambulatory Visit (HOSPITAL_COMMUNITY)
Admission: RE | Admit: 2012-02-25 | Discharge: 2012-02-25 | Disposition: A | Discharge status: Home / Self Care | Payer: Medicare Other | Source: Ambulatory Visit | Attending: Pulmonary Disease | Admitting: Pulmonary Disease

## 2012-02-25 NOTE — Progress Notes (Signed)
Physical Therapy Treatment Patient Details  Name: Malik Nicholson MRN: [id number] Date of Birth: 05/27/1941  Today's Date: 02/25/2012 Time: 0920-1015 PT Time Calculation (min): 55 min Charges: 15' Gait, 15' Manual, 15' TE, 10' Self Care Visit#: 4  of 10   Re-eval: 03/19/12   Authorization: [employer] Medicare  Authorization Time Period:    Authorization Visit#: 4  of 10    Subjective: Symptoms/Limitations Symptoms: Pt continues to complain of stiffness to his hip, but overall reports he feels he is getting better.  has improved ability getting up from a chair.  Pain Assessment Currently in Pain?: Yes Pain Score:  ("soreness") Pain Location: Hip Pain Orientation: Left  Precautions/Restrictions     Exercise/Treatments Aerobic Tread Mill: Gait trainer at .60 cyc/sec, R and Lt Step Length 58.0 x 9 mn with cueing to increase stride length and for posture Standing Heel Raises:  (HEP) Heel Raises Limitations:  (HEP) Lateral Step Up: Left;15 reps;Hand Hold: 1;Step Height: 4" Forward Step Up: Left;15 reps;Hand Hold: 1;Step Height: 4" Rocker Board: 2 minutes;Limitations Rocker Board Limitations: R/L without HHA, w/min A Seated Long Arc Quad: AROM;10 reps;Weights Long Arc Quad Weight: 3 lbs. Supine Quad Sets: 10 reps;Limitations (10 sec holds ) Straight Leg Raises: AAROM;Left;15 reps  Manual Therapy Manual Therapy: Massage Massage: STM to L hip to improve circulation and decrease scar tissue. x15 minutes  Physical Therapy Assessment and Plan PT Assessment and Plan Clinical Impression Statement: Pt is greatly limited by quad weakness.  Added LAQ's w/weight today to improve quad strength.    Goals    Problem List Patient Active Problem List  Diagnosis  . ANEMIA, NORMOCYTIC  . OSTEOARTHRITIS, HIP, LEFT  . OSTEOARTHRITIS, KNEE, LEFT  . JOINT EFFUSION, LEFT KNEE  . DEGENERATIVE DISC DISEASE, LUMBAR SPINE  . DYSPHAGIA UNSPECIFIED  . Mononeuritis leg  . DDD (degenerative disc  disease)  . Osteoarthritis of hip  . DDD (degenerative disc disease), lumbosacral  . Spinal stenosis  . OA (osteoarthritis) of hip  . S/P total hip arthroplasty  . Difficulty in walking  . Muscle weakness (generalized)    PT Plan of Care PT Home Exercise Plan: Updated HEP to requested 3 best exercises to do PT Patient Instructions: Pt educated on importance of continuing with proper posture with gait and stride length.  Consulted and Agree with Plan of Care: Patient  Flannery Cavallero, PT 02/25/2012, 10:24 AM

## 2012-02-27 ENCOUNTER — Ambulatory Visit (HOSPITAL_COMMUNITY)
Admission: RE | Admit: 2012-02-27 | Discharge: 2012-02-27 | Disposition: A | Discharge status: Home / Self Care | Payer: Medicare Other | Source: Ambulatory Visit | Attending: Pulmonary Disease | Admitting: Pulmonary Disease

## 2012-02-27 NOTE — Progress Notes (Signed)
Physical Therapy Treatment Patient Details  Name: Malik Nicholson MRN: [id number] Date of Birth: [birth year]  Today's Date: 02/27/2012 Time: 0930-1015 PT Time Calculation (min): 45 min  Visit#: 5  of 10   Re-eval: 03/19/12  Charge: Gait 12, therex 32'   Authorization: [employer] Medicare   Authorization Visit#: 5  of 10    Subjective: Symptoms/Limitations Symptoms: Pt with no pain this session, does complian of stiffness to his hip.    Objective:   Exercise/Treatments Aerobic Tread Mill: Gait trainer at .60 cyc/sec, R and Lt Step Length 58.0 x 10 min with cueing to increase stride length and for posture Standing Lateral Step Up: Left;15 reps;Hand Hold: 1;Step Height: 4" Forward Step Up: Left;15 reps;Hand Hold: 1;Step Height: 4" Step Down: Left;10 reps;Hand Hold: 1;Step Height: 4" Rocker Board: 3 minutes;Limitations Rocker Board Limitations: R/L without HHA, w/min A Seated Long Arc Quad: AROM;Strengthening;Left;15 reps;Weights Long Arc Quad Weight: 3 lbs. Supine Quad Sets: 10 reps;Limitations Short Arc Quad Sets: 10 reps;Limitations Straight Leg Raises: AAROM;Left;15 reps    Physical Therapy Assessment and Plan PT Assessment and Plan Clinical Impression Statement: Max vc-ing today with gait for posture and to increase stride length. Quad weakness continues to be a great limitation, pt requires AAROM with SLR. Added step down from 4in to POC today, weak eccentric control noted descending PT Plan: Follow hip precautions. Continue with LE strengthening and gait mechanics. Begin tandem stance next session to improve balance.    Goals    Problem List Patient Active Problem List  Diagnosis  . ANEMIA, NORMOCYTIC  . OSTEOARTHRITIS, HIP, LEFT  . OSTEOARTHRITIS, KNEE, LEFT  . JOINT EFFUSION, LEFT KNEE  . DEGENERATIVE DISC DISEASE, LUMBAR SPINE  . DYSPHAGIA UNSPECIFIED  . Mononeuritis leg  . DDD (degenerative disc disease)  . Osteoarthritis of hip  . DDD (degenerative disc  disease), lumbosacral  . Spinal stenosis  . OA (osteoarthritis) of hip  . S/P total hip arthroplasty  . Difficulty in walking  . Muscle weakness (generalized)    PT - End of Session Equipment Utilized During Treatment: Gait belt Activity Tolerance: Patient tolerated treatment well General Behavior During Session: [facility] for tasks performed Cognition: [facility] for tasks performed  GP    Juel Burrow 02/27/2012, 4:32 PM

## 2012-03-01 ENCOUNTER — Ambulatory Visit (HOSPITAL_COMMUNITY)
Admission: RE | Admit: 2012-03-01 | Discharge: 2012-03-01 | Disposition: A | Discharge status: Home / Self Care | Payer: Medicare Other | Source: Ambulatory Visit | Attending: Pulmonary Disease | Admitting: Pulmonary Disease

## 2012-03-01 NOTE — Progress Notes (Signed)
Physical Therapy Treatment Patient Details  Name: Malik Nicholson MRN: [id number] Date of Birth: [birth year]  Today's Date: 03/01/2012 Time: 0936-1020 PT Time Calculation (min): 44 min Charges: 32' TE, 8' Manual Visit#: 6  of 10   Re-eval: 03/19/12    Authorization: [employer] Medicare   Authorization Time Period:    Authorization Visit#: 6  of 10    Subjective: Symptoms/Limitations Symptoms: he reports that his hip is a little painful today, but overall is doing pretty well.    Precautions/Restrictions   HIP  Exercise/Treatments Aerobic Tread Mill: Pharmacist, hospital at .60 cyc/sec, R and Lt Step Length 58.0 x 9 min with cueing to increase stride length and for posture Machines for Strengthening Cybex Knee Extension: 2 PL x15 BLE Cybex Knee Flexion: 3 PL x15 Standing Knee Flexion: Left;20 reps;Limitations Knee Flexion Limitations: 3# Lateral Step Up: Left;20 reps;Hand Hold: 2;Step Height: 4" Forward Step Up: Left;20 reps;Hand Hold: 1;Step Height: 4" Step Down: Left;20 reps;Hand Hold: 1;Step Height: 4" Other Standing Knee Exercises: Hip Extension, Hip Abduction x10 w/ TC's and VC's for proper alignment Other Standing Knee Exercises: tandem gait (staggared gait) w/sacral-sternal and mod A for posture   Manual Therapy Manual Therapy: Massage Massage: R S/L STM to L hip to improve circulation and decrease scar tissue. x8 minutes   Physical Therapy Assessment and Plan PT Assessment and Plan Clinical Impression Statement: Continues to have difficulty with independent ambulation with significant reduction in R step length and inabliity to perform pure tandem gait due to balance impairments.  Continues to have limited pain after manual technqiues.  PT Plan: Continue to address balance and LE strengthening and decreasing pain    Goals    Problem List Patient Active Problem List  Diagnosis  . ANEMIA, NORMOCYTIC  . OSTEOARTHRITIS, HIP, LEFT  . OSTEOARTHRITIS, KNEE, LEFT  . JOINT  EFFUSION, LEFT KNEE  . DEGENERATIVE DISC DISEASE, LUMBAR SPINE  . DYSPHAGIA UNSPECIFIED  . Mononeuritis leg  . DDD (degenerative disc disease)  . Osteoarthritis of hip  . DDD (degenerative disc disease), lumbosacral  . Spinal stenosis  . OA (osteoarthritis) of hip  . S/P total hip arthroplasty  . Difficulty in walking  . Muscle weakness (generalized)    PT - End of Session Activity Tolerance: Patient tolerated treatment well General Behavior During Session: [facility] for tasks performed Cognition: [facility] for tasks performed  Ashyr Hedgepath, PT 03/01/2012, 11:14 AM

## 2012-03-03 ENCOUNTER — Ambulatory Visit (HOSPITAL_COMMUNITY)
Admission: RE | Admit: 2012-03-03 | Discharge: 2012-03-03 | Disposition: A | Discharge status: Home / Self Care | Payer: Medicare Other | Source: Ambulatory Visit | Attending: Pulmonary Disease | Admitting: Pulmonary Disease

## 2012-03-03 NOTE — Progress Notes (Signed)
Physical Therapy Treatment Patient Details  Name: Malik Nicholson MRN: [id number] Date of Birth: August 05, 1940  Today's Date: 03/03/2012 Time: 0933-1015 PT Time Calculation (min): 42 min  Visit#: 7  of 10   Re-eval: 03/19/12  Charge: Gait 10', Massage 8', therex 24'  Authorization: [employer] Medicare  Authorization Visit#: 7  of 10    Subjective: Symptoms/Limitations Symptoms: Pt stated L hip no real pain just a little sore today.  Pt reports ambulating indoor and outdoors with [facility] around the house. Pain Assessment Currently in Pain?: No/denies Pain Score:  (soreness) Pain Location: Hip Pain Orientation: Left  Objective:   Exercise/Treatments Aerobic Tread Mill: Gait trainer at .63 cyc/sec, R and Lt Step Length 58.0 x 10 min with cueing to increase stride length and for posture Machines for Strengthening Cybex Knee Extension: 2 PL x15 BLE Cybex Knee Flexion: 3.5  PL x15 Standing Lateral Step Up: Left;20 reps;Hand Hold: 2;Step Height: 4" Forward Step Up: Left;20 reps;Hand Hold: 1;Step Height: 4" Step Down: Left;20 reps;Hand Hold: 1;Step Height: 4" Other Standing Knee Exercises: tandem stance 3x 30" tandem gait (staggared gait) w/sacral-sternal and mod A for posture 2RT; retro gait 2 RT, side stepping 1RT with green tband   Manual Therapy Manual Therapy: Massage Massage: R S/L STM to L hip to improve circulation and decrease scar tissue. x8 minutes  Physical Therapy Assessment and Plan PT Assessment and Plan Clinical Impression Statement: Pt with antalgic gait without AD, with decrease step length R.  Gait mechanics improved following gait trainer on treadmill but did require constant cueing for equal stride length and posture.  Pt reported L hip soreness decreased following manual STM to increase circulation and decrease scar tissue PT Plan: Continue to address balance and LE strengthening and decreasing pain.    Goals    Problem List Patient Active Problem List  Diagnosis    . ANEMIA, NORMOCYTIC  . OSTEOARTHRITIS, HIP, LEFT  . OSTEOARTHRITIS, KNEE, LEFT  . JOINT EFFUSION, LEFT KNEE  . DEGENERATIVE DISC DISEASE, LUMBAR SPINE  . DYSPHAGIA UNSPECIFIED  . Mononeuritis leg  . DDD (degenerative disc disease)  . Osteoarthritis of hip  . DDD (degenerative disc disease), lumbosacral  . Spinal stenosis  . OA (osteoarthritis) of hip  . S/P total hip arthroplasty  . Difficulty in walking  . Muscle weakness (generalized)    PT - End of Session Equipment Utilized During Treatment: Gait belt Activity Tolerance: Patient tolerated treatment well General Behavior During Session: [facility] for tasks performed Cognition: [facility] for tasks performed  GP    Juel Burrow 03/03/2012, 11:57 AM

## 2012-03-05 ENCOUNTER — Ambulatory Visit (HOSPITAL_COMMUNITY)
Admission: RE | Admit: 2012-03-05 | Discharge: 2012-03-05 | Disposition: A | Discharge status: Home / Self Care | Payer: Medicare Other | Source: Ambulatory Visit | Attending: Pulmonary Disease | Admitting: Pulmonary Disease

## 2012-03-05 NOTE — Progress Notes (Signed)
Physical Therapy Treatment Patient Details  Name: Malik Nicholson MRN: [id number] Date of Birth: January 12, 1941  Today's Date: 03/05/2012 Time: 5284-1324 PT Time Calculation (min): 43 min Charges: 15' Gait, 20' TE, 8 Manual  Visit#: 8  of 10   Re-eval: 03/19/12   Authorization: [employer]  Authorization Time Period:    Authorization Visit#: 8  of 10    Subjective: Symptoms/Limitations Symptoms: Pt reports that he is walking pretty well at home.  He reports he is getting along well at home, but is still feeling like he needs more strength and improved balance.  Pain Assessment Currently in Pain?: Yes ("soreness") Pain Location: Hip Pain Orientation: Left  Precautions/Restrictions     Exercise/Treatments Aerobic Tread Mill: Gait trainer at .60-0.62 cyc/sec, R and Lt Step Length 58.0 x 10 min with cueing to increase stride length and for posture. w/ 1 and 2 HHA.  Machines for Strengthening Cybex Knee Extension: 3 PL 2x15 Cybex Knee Flexion: 4.5 PL 2x15 Standing Other Standing Knee Exercises: hip abduction w/cueing for toes in x15 Standing Tandem Gait: Forward;2 reps Other Standing Exercises: Heel Walking 2 RT Other Standing Exercises: Toe Walking 2 RT   Manual Therapy Massage: R S/L STM to L hip to improve circulation and decrease scar tissue. x8 minutes   Physical Therapy Assessment and Plan PT Assessment and Plan Clinical Impression Statement: Has greatest limitations with L heel strike w.gait and continues to require mod cueing for appropriate gait mechanics and posture.  PT Plan: Continue to address balance and LE strengthening and decreasing pain.    Goals    Problem List Patient Active Problem List  Diagnosis  . ANEMIA, NORMOCYTIC  . OSTEOARTHRITIS, HIP, LEFT  . OSTEOARTHRITIS, KNEE, LEFT  . JOINT EFFUSION, LEFT KNEE  . DEGENERATIVE DISC DISEASE, LUMBAR SPINE  . DYSPHAGIA UNSPECIFIED  . Mononeuritis leg  . DDD (degenerative disc disease)  . Osteoarthritis  of hip  . DDD (degenerative disc disease), lumbosacral  . Spinal stenosis  . OA (osteoarthritis) of hip  . S/P total hip arthroplasty  . Difficulty in walking  . Muscle weakness (generalized)    PT - End of Session Equipment Utilized During Treatment: Gait belt Activity Tolerance: Patient tolerated treatment well General Behavior During Session: [facility] for tasks performed Cognition: [facility] for tasks performed  Orpheus Hayhurst, PT 03/05/2012, 10:15 AM

## 2012-03-08 ENCOUNTER — Ambulatory Visit (HOSPITAL_COMMUNITY)
Admission: RE | Admit: 2012-03-08 | Discharge: 2012-03-08 | Disposition: A | Discharge status: Home / Self Care | Payer: Medicare Other | Source: Ambulatory Visit | Attending: Pulmonary Disease | Admitting: Pulmonary Disease

## 2012-03-08 NOTE — Progress Notes (Signed)
Physical Therapy Treatment Patient Details  Name: SIGIFREDO PIGNATO MRN: [id number] Date of Birth: 08-02-1940  Today's Date: 03/08/2012 Time: 0925-1010 PT Time Calculation (min): 45 min Visit#: 9  of 10   Re-eval: 03/19/12 Authorization: [employer]  Authorization Visit#: 9  of 10   Charges:  Gait 10', NMR 14', Therex 8', STM 8'  Subjective: Symptoms/Limitations Symptoms: Pt. states he is thinking about joining a gym near his house.  States he feels his balance is improving but not quite there yet. Pain Assessment Currently in Pain?: Yes Pain Score:   2 Pain Location: Hip Pain Orientation: Left   Exercise/Treatments Aerobic Tread Mill: Gait trainer at .60-0.64 cyc/sec, R and Lt Step Length 58.0 x 8 min with cueing to increase stride length and for posture. w/ 1 and 2 HHA.  Machines for Strengthening Cybex Knee Extension: 3 PL 2x15 L only Cybex Knee Flexion: 4.5 PL 2x15 L only Standing Other Standing Knee Exercises: tandem gait, heelwalk, toewalk 2RT each Sidelying Clams: 10 reps L in R SL   Manual Therapy Manual Therapy: Massage Massage: in R SL to L hip to increase circulation and decrease scar tissue with MFR.  Physical Therapy Assessment and Plan PT Assessment and Plan Clinical Impression Statement: Pt. continues to require multimodal cues with ambulation, tends to circumduct R LE to advance and needs cues for proper stride, posture.  No LOB today with balance activities. PT Plan: Continue to address balance and LE strengthening and decreasing pain.     Problem List Patient Active Problem List  Diagnosis  . ANEMIA, NORMOCYTIC  . OSTEOARTHRITIS, HIP, LEFT  . OSTEOARTHRITIS, KNEE, LEFT  . JOINT EFFUSION, LEFT KNEE  . DEGENERATIVE DISC DISEASE, LUMBAR SPINE  . DYSPHAGIA UNSPECIFIED  . Mononeuritis leg  . DDD (degenerative disc disease)  . Osteoarthritis of hip  . DDD (degenerative disc disease), lumbosacral  . Spinal stenosis  . OA (osteoarthritis) of hip  .  S/P total hip arthroplasty  . Difficulty in walking  . Muscle weakness (generalized)    Lurena Nida, PTA/CLT 03/08/2012, 12:12 PM

## 2012-03-10 ENCOUNTER — Ambulatory Visit (HOSPITAL_COMMUNITY)
Admission: RE | Admit: 2012-03-10 | Discharge: 2012-03-10 | Disposition: A | Discharge status: Home / Self Care | Payer: Medicare Other | Source: Ambulatory Visit | Attending: Pulmonary Disease | Admitting: Pulmonary Disease

## 2012-03-10 NOTE — Progress Notes (Signed)
Physical Therapy Treatment/G-code update Patient Details  Name: Malik Nicholson MRN: [id number] Date of Birth: 1941/01/29  Today's Date: 03/10/2012 Time: 0925-1020 PT Time Calculation (min): 55 min Charge: Gait 10', massage 8', therex 32'  Visit#: 10  of 10   Re-eval: 03/19/12 (MD apt on 03/18/2012)  Assessment Diagnosis: L THR Surgical Date: 02/02/12 Next MD Visit: 03/16/2012  Authorization: [facility] Medicare  Authorization Time Period: G Code for mobility on 10th visit.  Current: CI, goal: CI  Authorization Visit#: 10  of 10    Subjective: Symptoms/Limitations Symptoms: Pt reported no pain L hip, does report swollen knee this morning.  Pt states he feels stronger but still finds difficulty with balance activities. Pain Assessment Currently in Pain?: No/denies ABC Score: 83%  Precautions/Restrictions   Hip   Exercise/Treatments Aerobic Tread Mill: Gait trainer at .60-0.64 cyc/sec, R and Lt Step Length 58.0 x 10 min with cueing to increase stride length and for posture. w/ 1 and 2 HHA.  Machines for Strengthening Cybex Knee Extension: 3 Pl x 15 L only, 3.5 PL BLE Cybex Knee Flexion: 4.5 PL 2x15 L only Standing Lateral Step Up: Left;15 reps;Hand Hold: 1;Step Height: 6" Forward Step Up: Left;15 reps;Hand Hold: 1;Step Height: 6" Other Standing Knee Exercises: tandem gait on static and balance beam, retro, heelwalk, toewalk 2RT each Sidelying Clams: 10 reps x 10" L in R SL   Physical Therapy Assessment and Plan PT Assessment and Plan Clinical Impression Statement: Pt continues to require multimodal cueing with gait training for posture, increase stride length and reduce R LE circumduction when advancing forward.  Pt with good control with balance activities on static surfaces, progressed to tandem gait on balance beam with min assistance for LOB episodes and cueing for spatial awareness.  GCode complete this session.  Pt with increased perceived activities specific balance  confidence scale, increased to 83% (was 73% at initial eval). PT Plan: Continue to address balance and LE strengthening and decreasing pain.  Progress stair training and improve gait mechanics.    Goals    Problem List Patient Active Problem List  Diagnosis  . ANEMIA, NORMOCYTIC  . OSTEOARTHRITIS, HIP, LEFT  . OSTEOARTHRITIS, KNEE, LEFT  . JOINT EFFUSION, LEFT KNEE  . DEGENERATIVE DISC DISEASE, LUMBAR SPINE  . DYSPHAGIA UNSPECIFIED  . Mononeuritis leg  . DDD (degenerative disc disease)  . Osteoarthritis of hip  . DDD (degenerative disc disease), lumbosacral  . Spinal stenosis  . OA (osteoarthritis) of hip  . S/P total hip arthroplasty  . Difficulty in walking  . Muscle weakness (generalized)    PT - End of Session Equipment Utilized During Treatment: Gait belt Activity Tolerance: Patient tolerated treatment well General Behavior During Session: [facility] for tasks performed Cognition: [facility] for tasks performed  GP Functional Assessment Tool Used: Based on ABC score (83% confident, 17% impairement) Functional Limitation: Mobility: Walking and moving around Mobility: Walking and Moving Around Current Status ([id number]): At least 1 percent but less than 20 percent impaired, limited or restricted Mobility: Walking and Moving Around Goal Status [phone]): At least 1 percent but less than 20 percent impaired, limited or restricted  Juel Burrow 03/10/2012, 10:39 AM

## 2012-03-12 ENCOUNTER — Ambulatory Visit (HOSPITAL_COMMUNITY)
Admission: RE | Admit: 2012-03-12 | Discharge: 2012-03-12 | Disposition: A | Discharge status: Home / Self Care | Payer: Medicare Other | Source: Ambulatory Visit | Attending: Physical Therapy | Admitting: Physical Therapy

## 2012-03-12 NOTE — Progress Notes (Signed)
Physical Therapy Treatment Patient Details  Name: Malik Nicholson MRN: [id number] Date of Birth: [birth year]  Today's Date: 03/12/2012 Time: 0918-1015 PT Time Calculation (min): 57 min  Visit#: 11  of 12   Re-eval: 03/16/12 (MD apt on 03/16/2012)  Charge:  Gait 10', therex 30', massage 12'   Authorization: [facility] Medicare  Authorization Time Period: G Code for mobility on 10th visit. Current: CI, goal: CI   Authorization Visit#: 11  of 20    Subjective: Symptoms/Limitations Symptoms: His c/co is soreness in his hip when he is walking without the cane.  He rates his pain about 6-7/10, but does not have much pain with the cane.  He reports that he does tend to forget the cane in his house and does not have too much pain.  Pain Assessment Currently in Pain?: Yes Pain Score:   2 Pain Location: Hip Pain Orientation: Left  Objective:   Exercise/Treatments Aerobic Tread Mill: 1.5 mph w/B HHA w/cueing for posture and stride length Machines for Strengthening Cybex Knee Extension: 3.5 PL 2x15 Cybex Knee Flexion: 4.5 PL 2x15 BLE only Standing Lateral Step Up: Left;15 reps;Hand Hold: 1;Step Height: 6" Forward Step Up: Left;15 reps;Hand Hold: 1;Step Height: 6" Step Down: Left;20 reps;Hand Hold: 1;Step Height: 4" Wall Squat: 10 reps;5 seconds Other Standing Knee Exercises: tandem gait on static and balance beam, retro, heelwalk, toewalk 2RT each Sidelying Clams: 10 reps x 10" L in R SL  Manual Therapy Manual Therapy: Massage Massage: in R SL to L hip to increase circulation and decrease scar tissue with MFR x 12  Physical Therapy Assessment and Plan PT Assessment and Plan Clinical Impression Statement: Pt continues to require multimodal cueing for posture, to increase stride length and reduce R LE circumduction with gait training.  Improved mechanics noted with stair training this session following cues for posture.  Still noted weak eccentric control descending steps. PT Plan:  Reassess next session with initial evaluating therapist prior MD apt on 03/16/2012.  Continue to improve gait mechanics, progress strength and balance.    Goals    Problem List Patient Active Problem List  Diagnosis  . ANEMIA, NORMOCYTIC  . OSTEOARTHRITIS, HIP, LEFT  . OSTEOARTHRITIS, KNEE, LEFT  . JOINT EFFUSION, LEFT KNEE  . DEGENERATIVE DISC DISEASE, LUMBAR SPINE  . DYSPHAGIA UNSPECIFIED  . Mononeuritis leg  . DDD (degenerative disc disease)  . Osteoarthritis of hip  . DDD (degenerative disc disease), lumbosacral  . Spinal stenosis  . OA (osteoarthritis) of hip  . S/P total hip arthroplasty  . Difficulty in walking  . Muscle weakness (generalized)    PT - End of Session Equipment Utilized During Treatment: Gait belt Activity Tolerance: Patient tolerated treatment well General Behavior During Session: [facility] for tasks performed Cognition: [facility] for tasks performed  GP    Juel Burrow 03/12/2012, 1:53 PM

## 2012-03-16 ENCOUNTER — Encounter: Payer: Self-pay | Admitting: Orthopedic Surgery

## 2012-03-16 ENCOUNTER — Ambulatory Visit (HOSPITAL_COMMUNITY)
Admission: RE | Admit: 2012-03-16 | Discharge: 2012-03-16 | Disposition: A | Discharge status: Home / Self Care | Payer: Medicare Other | Source: Ambulatory Visit | Attending: Pulmonary Disease | Admitting: Pulmonary Disease

## 2012-03-16 ENCOUNTER — Ambulatory Visit (INDEPENDENT_AMBULATORY_CARE_PROVIDER_SITE_OTHER): Payer: Medicare Other | Admitting: Orthopedic Surgery

## 2012-03-16 VITALS — Ht 71.0 in | Wt 215.0 lb

## 2012-03-16 DIAGNOSIS — Z96649 Presence of unspecified artificial hip joint: Secondary | ICD-10-CM

## 2012-03-16 NOTE — Evaluation (Signed)
Physical Therapy Re-Evaluation  Patient Details  Name: Malik Nicholson MRN: [id number] Date of Birth: [birth year]  Today's Date: 03/16/2012 Time: 7829-5621 PT Time Calculation (min): 34 min Charges: 1 MMT, 15' Manual, 10' TE Visit#: 12  of 18   Re-eval: 03/30/12 (MD apt on 03/16/2012) Assessment Diagnosis: L THR Surgical Date: 02/02/12 Next MD Visit: 03/16/2012  Authorization: [facility] Medicare  Authorization Time Period: G Code for mobility on 10th visit. Current: CI, goal: CI   Authorization Visit#: 12  of 20    Subjective Symptoms/Limitations Symptoms: Pt reports that he is doing well overall.  he reports that he is walking around his house independent, but continues to require a SPC in community.   Cognition/Observation Observation/Other Assessments Observations: SLR with 5 degree ext lag (was 25 degree ext lag) Other Assessments: Favors LLE with standing and going from STS.  Moderate rounded shoulders, has appropriate posture when standing up straight  Sensation/Coordination/Flexibility/Functional Tests Coordination Gross Motor Movements are Fluid and Coordinated: No Coordination and Movement Description: pt continues to display moderate coordination difficulty with squats.  Functional Tests Functional Tests: ABC - 83.  Assessment RLE Strength Right Hip Flexion: 5/5 (was 4/5) Right Knee Flexion:  (was 4/5) LLE Strength Left Hip Flexion: 5/5 (was 4+/5) Left Hip Extension: 4/5 (impaired coordination (was 2+/5)) Left Knee Flexion: 5/5 (was 4/5) Left Knee Extension: 5/5  Mobility/Balance  Ambulation/Gait Ambulation/Gait Assistance: 6: Modified independent (Device/Increase time) Assistive device: Straight cane (was RW) Gait Pattern: Left flexed knee in stance;Left foot flat;Decreased trunk rotation (w/independent gait) Static Standing Balance Single Leg Stance - Right Leg: 3  (was 3) Single Leg Stance - Left Leg: 2  (was 0) Timed Up and Go Test TUG: Normal TUG Normal  TUG (seconds): 15.5  (independent (was 25 w/RW))   Exercise/Treatments Standing Functional Squat: 10 reps (L LE only) Seated Other Seated Knee Exercises: STS x15 w/LLE only (min support from RLE and B UE support) Supine Bridges: 10 reps (w/R SLR) Prone  Hip Extension: Both;10 reps;Limitations Hip Extension Limitations: VC's and TC for proper L hip extension to improve coordination    Manual Therapy Massage: in R SL to L hip to increase circulation and decrease scar tissue with MFR   Physical Therapy Assessment and Plan PT Assessment and Plan Clinical Impression Statement: Mr. Lupercio has attended 57 OP PT visits s/p L THR w/following findings: he is progressing well towards goals, overall strength has improved, continues to have greatest limitations with functional strength and coordination with hip extesnion movements and continues to have some mild-mod pain with ambulation.  He continues to favor LLE with STS activities and with independent gait.  Will continue to benefit from skilled OP PT 3x/week x2 weeks to address remaining goals and function.  PT Frequency: Min 3X/week PT Duration:  (2 weeks) PT Plan: F/U w/MD apt.  Continue to address appropriate coordination and functional hip extension strength.  Continue with stair training and comfort with outdoor ambulation.     Goals Home Exercise Program Pt will Perform Home Exercise Program: Independently PT Goal: Perform Home Exercise Program - Progress: Partly met PT Short Term Goals Time to Complete Short Term Goals: 3 weeks PT Short Term Goal 1: Pt will report pain less than 2/33for 75% of his day to his L hip.  PT Short Term Goal 1 - Progress: Progressing toward goal (Pain is 2-3/10) PT Short Term Goal 2: Pt will ambulate x10 minutes w/SPC indoor and outdoor enviroments w/supervision.  PT Short Term Goal 2 -  Progress: Met (30 minutes w/SPC outdoor/shopping.  I indoors) PT Short Term Goal 3: Pt will improve LE strength by 1 muscle  grade and demonstrate appropriate L SLR without extension lag.  PT Short Term Goal 3 - Progress: Met PT Short Term Goal 4: Pt will improve LE coordination and perform a funcitonal squat without favoring LLE.  PT Short Term Goal 4 - Progress: Met PT Long Term Goals Time to Complete Long Term Goals: Other (comment) (6 weeks. ) PT Long Term Goal 1: Pt will improve his TUG time with a SPC to less than or equal to 13 seconds for improved safey with ambulating in the community.  PT Long Term Goal 1 - Progress: Progressing toward goal (independent in 15.5 sec) PT Long Term Goal 2: Pt will improve LE functional strength and demonstrate 5 STS in 30 seconds and ascend and descend 5 stairs w/1 handrail and supervision in order to enter community dwellings.  PT Long Term Goal 2 - Progress: Progressing toward goal (5 STS in 30 sec, ascend reciprocal, descend step to) Long Term Goal 3: Pt will improve his ABC to 80% for improved percieved funcitonal ability with balance.  Long Term Goal 3 Progress: Met (83%)  Problem List Patient Active Problem List  Diagnosis  . ANEMIA, NORMOCYTIC  . OSTEOARTHRITIS, HIP, LEFT  . OSTEOARTHRITIS, KNEE, LEFT  . JOINT EFFUSION, LEFT KNEE  . DEGENERATIVE DISC DISEASE, LUMBAR SPINE  . DYSPHAGIA UNSPECIFIED  . Mononeuritis leg  . DDD (degenerative disc disease)  . Osteoarthritis of hip  . DDD (degenerative disc disease), lumbosacral  . Spinal stenosis  . OA (osteoarthritis) of hip  . S/P total hip arthroplasty  . Difficulty in walking  . Muscle weakness (generalized)    PT - End of Session Equipment Utilized During Treatment: Gait belt Activity Tolerance: Patient tolerated treatment well General Behavior During Session: [facility] for tasks performed Cognition: [facility] for tasks performed  Chen Saadeh, PT 03/16/2012, 9:27 AM  Physician Documentation Your signature is required to indicate approval of the treatment plan as stated above.  Please sign and either send  electronically or make a copy of this report for your files and return this physician signed original.   Please mark one 1.__approve of plan  2. ___approve of plan with the following conditions.   ______________________________                                                          _____________________ Physician Signature                                                                                                             Date

## 2012-03-16 NOTE — Patient Instructions (Signed)
Continue therapy   Increase activity as tolerated

## 2012-03-16 NOTE — Progress Notes (Signed)
Patient ID: Malik Nicholson, male   DOB: 10/05/1940, 71 y.o.   MRN: [id number] Chief Complaint  Patient presents with  . Follow-up    Post op #2, left THA. DOS 02-02-12.   Postop visit status post LEFT total  hip replacement  Date of surgery  02-02-2012  Diagnosis  Osteoarthritis  Operative findings Displaced femoral neck fracture  Implant DEPUY GRIPTION CUP NO SCREWS 58. SIZE 10 TRILOCK STEM HO 1.5 NECK AND 36 HIP HEAD 58 LINER   DVT prophylaxis  Lovenox x 30 days YES  Living situation HOME  Complaints KNEE PAIN   Exam NORMAL LEG LENGTHS ARE EQUAL   Plan PT  F/U 6 WEEKS

## 2012-03-17 ENCOUNTER — Ambulatory Visit (HOSPITAL_COMMUNITY)
Admission: RE | Admit: 2012-03-17 | Discharge: 2012-03-17 | Disposition: A | Discharge status: Home / Self Care | Payer: Medicare Other | Source: Ambulatory Visit | Attending: Pulmonary Disease | Admitting: Pulmonary Disease

## 2012-03-17 NOTE — Progress Notes (Signed)
Physical Therapy Treatment Patient Details  Name: Malik Nicholson MRN: [id number] Date of Birth: May 22, 1941  Today's Date: 03/17/2012 Time: 0850-0931 PT Time Calculation (min): 41 min  Visit#: 13  of 18   Re-eval: 03/30/12  Charge: Gait 23', therex 10', manual 8'  Authorization: [employer] Medicare  Authorization Time Period: G Code for mobility on 10th visit. Current: CI, goal: CI  Authorization Visit#: 13  of 20    Subjective: Symptoms/Limitations Symptoms: Pt reports he is doing well, no c/o today. Pain Assessment Currently in Pain?: No/denies  Objective:   Exercise/Treatments Standing Functional Squat: 2 sets;10 reps;Limitations (2nd set L LE only) Functional Squat Limitations: max VC's and TC's for proper coordinated movement Gait Training: Outdoor ambulation with SPC x 23 min slopes, through grass and hills Supine Bridges: 20 reps Prone  Hip Extension: 15 reps;Limitations Hip Extension Limitations: VC's and TC for proper L hip extension to improve coordination    Manual Therapy Manual Therapy: Massage Massage: in R SL to L hip to increase circulation and decrease scar tissue with MFR  Physical Therapy Assessment and Plan PT Assessment and Plan Clinical Impression Statement: Began outdoor ambulation with SPC and CGA.  Pt able to ambulate inclines/declines, through grass and curbs wtih no LOB episodes and proper gait mechanics with SPC.  Pt does show an antalgic gait pattern without AD.  Pt required multimodal cueing with therex for proper form.   PT Plan: Continue to address appropriate coordination and functional hip extension strength. Continue with stair training and comfort with outdoor ambulation.     Goals    Problem List Patient Active Problem List  Diagnosis  . ANEMIA, NORMOCYTIC  . OSTEOARTHRITIS, HIP, LEFT  . OSTEOARTHRITIS, KNEE, LEFT  . JOINT EFFUSION, LEFT KNEE  . DEGENERATIVE DISC DISEASE, LUMBAR SPINE  . DYSPHAGIA UNSPECIFIED  . Mononeuritis leg    . DDD (degenerative disc disease)  . Osteoarthritis of hip  . DDD (degenerative disc disease), lumbosacral  . Spinal stenosis  . OA (osteoarthritis) of hip  . S/P total hip arthroplasty  . Difficulty in walking  . Muscle weakness (generalized)  . S/P hip replacement    PT - End of Session Equipment Utilized During Treatment: Gait belt Activity Tolerance: Patient tolerated treatment well General Behavior During Session: [facility] for tasks performed Cognition: [facility] for tasks performed  GP    Juel Burrow 03/17/2012, 10:25 AM

## 2012-03-23 ENCOUNTER — Ambulatory Visit (HOSPITAL_COMMUNITY)
Admission: RE | Admit: 2012-03-23 | Discharge: 2012-03-23 | Disposition: A | Discharge status: Home / Self Care | Payer: Medicare Other | Source: Ambulatory Visit | Attending: Orthopedic Surgery | Admitting: Orthopedic Surgery

## 2012-03-23 DIAGNOSIS — R262 Difficulty in walking, not elsewhere classified: Secondary | ICD-10-CM | POA: Insufficient documentation

## 2012-03-23 DIAGNOSIS — IMO0001 Reserved for inherently not codable concepts without codable children: Secondary | ICD-10-CM | POA: Insufficient documentation

## 2012-03-23 DIAGNOSIS — M25659 Stiffness of unspecified hip, not elsewhere classified: Secondary | ICD-10-CM | POA: Insufficient documentation

## 2012-03-23 DIAGNOSIS — R279 Unspecified lack of coordination: Secondary | ICD-10-CM | POA: Insufficient documentation

## 2012-03-23 DIAGNOSIS — M6281 Muscle weakness (generalized): Secondary | ICD-10-CM | POA: Insufficient documentation

## 2012-03-23 DIAGNOSIS — M25559 Pain in unspecified hip: Secondary | ICD-10-CM | POA: Insufficient documentation

## 2012-03-23 NOTE — Progress Notes (Signed)
Physical Therapy Treatment Patient Details  Name: Malik Nicholson MRN: [id number] Date of Birth: May 22, 1941  Today's Date: 03/23/2012 Time: 0932-1016 PT Time Calculation (min): 44 min Charges: gait x17 min, NMR x 16', TE x8 Visit#: 14  of 18   Re-eval: 03/30/12    Authorization: [employer] Medicare  Authorization Time Period: G Code for mobility on 10th visit. Current: CI, goal: CI  Authorization Visit#: 14  of 20    Subjective: Symptoms/Limitations Symptoms: I am doing ok today.   Precautions/Restrictions     Exercise/Treatments Aerobic Tread Mill: 1.5-1.6 mph w/TC and VC for posture x10 minutes Machines for Strengthening Cybex Knee Extension: 4 PL x20 Cybex Knee Flexion: 6 PL x20 Standing Gait Training: Outdoor ambulation w/CGA- Supervion w/o AD up and down 4 curb, sidewalk, asphalt and grass x7 minutes. cueing for posture and proper mobility.  Standing Tandem Stance: Eyes open;2 reps;30 secs (BLE) Standing, One Foot on a Step: Eyes open;2 inch;3 reps;30 secs Tandem Gait: Forward;2 reps;Limitations Tandem Gait Limitations: Supervision Retro Gait: 2 reps;Limitations Retro Gait Limitations: CGA Other Standing Exercises: Rocker Board 1 min 0 HHA, min A S<>S Other Standing Exercises: Heel and Toe walking 3 RT (CGA)   Physical Therapy Assessment and Plan PT Assessment and Plan Clinical Impression Statement: Pt continues to have greatest limitaions with gastroc strength.  Overall static balance has improved and requires only CGA- Supervision level for outdoor ambulation w/o AD PT Plan: Continue to address appropriate coordination and functional hip extension strength. Continue with stair training and comfort with outdoor ambulation.     Goals    Problem List Patient Active Problem List  Diagnosis  . ANEMIA, NORMOCYTIC  . OSTEOARTHRITIS, HIP, LEFT  . OSTEOARTHRITIS, KNEE, LEFT  . JOINT EFFUSION, LEFT KNEE  . DEGENERATIVE DISC DISEASE, LUMBAR SPINE  . DYSPHAGIA UNSPECIFIED   . Mononeuritis leg  . DDD (degenerative disc disease)  . Osteoarthritis of hip  . DDD (degenerative disc disease), lumbosacral  . Spinal stenosis  . OA (osteoarthritis) of hip  . S/P total hip arthroplasty  . Difficulty in walking  . Muscle weakness (generalized)  . S/P hip replacement    PT - End of Session Equipment Utilized During Treatment: Gait belt Activity Tolerance: Patient tolerated treatment well General Behavior During Session: [facility] for tasks performed Cognition: [facility] for tasks performed  GP    Malik Nicholson 03/23/2012, 10:18 AM

## 2012-03-25 ENCOUNTER — Ambulatory Visit (HOSPITAL_COMMUNITY)
Admission: RE | Admit: 2012-03-25 | Discharge: 2012-03-25 | Disposition: A | Discharge status: Home / Self Care | Payer: Medicare Other | Source: Ambulatory Visit | Attending: Orthopedic Surgery | Admitting: Orthopedic Surgery

## 2012-03-25 NOTE — Progress Notes (Signed)
Physical Therapy Treatment Patient Details  Name: Malik Nicholson MRN: [id number] Date of Birth: 12-15-1940  Today's Date: 03/25/2012 Time: 1478-2956 PT Time Calculation (min): 40 min Charges: 11' Manual, 29' TE Visit#: 15  of 18   Re-eval: 03/30/12    Authorization: [employer] Medicare  Authorization Time Period: G Code for mobility on 10th visit. Current: CI, goal: CI  Authorization Visit#: 15  of 20    Subjective:    Precautions/Restrictions     Exercise/Treatments Mobility/Balance        Stretches   Aerobic Tread Mill: 1.5 mph @ incline 1 x10 minutes w/min-mod verbal and sacral-sternal cueing for posture to encourage activtiy tolerance and decrease antalgic gait.  Machines for Strengthening Cybex Knee Extension: 4.5 PL x20 BLE Cybex Knee Flexion: 6.5 PL x20 BLE, x5 LLE Plyometrics   Standing   Seated   Supine   Sidelying   Prone      Balance Exercises Standing Tandem Gait: Forward;Limitations Tandem Gait Limitations: Balance beam x8 RT on dark blue balance beam only w/CGA Numbers 1-15: Balance Beam;1 rep (RUE only)  Yoga Poses    Seated    Supine    Manual Therapy Manual Therapy: Massage Massage: in R SL to L hip to increase circulation and decrease scar tissue with MFR x11 min   Physical Therapy Assessment and Plan PT Assessment and Plan Clinical Impression Statement: Added dynamic surfaces for balance today and was able to complete with CGA by end w/appropriate posture. Continues to require mod cueing for posture and decrease antalgic gait.  PT Plan: Continue to address appropriate coordination and functional hip extension strength. Continue with stair training and comfort with outdoor ambulation.     Goals    Problem List Patient Active Problem List  Diagnosis  . ANEMIA, NORMOCYTIC  . OSTEOARTHRITIS, HIP, LEFT  . OSTEOARTHRITIS, KNEE, LEFT  . JOINT EFFUSION, LEFT KNEE  . DEGENERATIVE DISC DISEASE, LUMBAR SPINE  . DYSPHAGIA UNSPECIFIED  .  Mononeuritis leg  . DDD (degenerative disc disease)  . Osteoarthritis of hip  . DDD (degenerative disc disease), lumbosacral  . Spinal stenosis  . OA (osteoarthritis) of hip  . S/P total hip arthroplasty  . Difficulty in walking  . Muscle weakness (generalized)  . S/P hip replacement    PT - End of Session Equipment Utilized During Treatment: Gait belt Activity Tolerance: Patient tolerated treatment well General Behavior During Session: [facility] for tasks performed Cognition: [facility] for tasks performed  GP    Rubel Heckard 03/25/2012, 10:11 AM

## 2012-03-26 ENCOUNTER — Ambulatory Visit (HOSPITAL_COMMUNITY)
Admission: RE | Admit: 2012-03-26 | Discharge: 2012-03-26 | Disposition: A | Discharge status: Home / Self Care | Payer: Medicare Other | Source: Ambulatory Visit | Attending: Pulmonary Disease | Admitting: Pulmonary Disease

## 2012-03-26 NOTE — Progress Notes (Addendum)
Physical Therapy Treatment Patient Details  Name: Malik Nicholson MRN: [id number] Date of Birth: 08/18/1940  Today's Date: 03/26/2012 Time: 4098-1191 PT Time Calculation (min): 43 min 15' therex 20' gait 10' NMR  Visit#: 55  of 18   Re-eval: 03/30/12    Authorization:    Authorization Time Period:    Authorization Visit#: 16  of 20    Subjective: Symptoms/Limitations Symptoms: I feel good today. No complaints.  Precautions/Restrictions     Exercise/Treatments   Aerobic Tread Mill: 1.5 mph @ incline 1 x10 minutes w/min-mod verbal and sacral-sternal cueing for posture to encourage activtiy tolerance and decrease antalgic gait.  Machines for Strengthening Cybex Knee Extension: 4.5 PL x20 BLE Cybex Knee Flexion: 6.5 PL x20 BLE, x5 LLE      Physical Therapy Assessment and Plan PT Assessment and Plan Clinical Impression Statement: Patient doing very well with balance and dynamic gait surfaces;no LOB. Continue to lessen antalgic gait and increase difficulty of balance activities. Continue PT plan of cre PT Plan: continue PT plan of care    Goals    Problem List Patient Active Problem List  Diagnosis  . ANEMIA, NORMOCYTIC  . OSTEOARTHRITIS, HIP, LEFT  . OSTEOARTHRITIS, KNEE, LEFT  . JOINT EFFUSION, LEFT KNEE  . DEGENERATIVE DISC DISEASE, LUMBAR SPINE  . DYSPHAGIA UNSPECIFIED  . Mononeuritis leg  . DDD (degenerative disc disease)  . Osteoarthritis of hip  . DDD (degenerative disc disease), lumbosacral  . Spinal stenosis  . OA (osteoarthritis) of hip  . S/P total hip arthroplasty  . Difficulty in walking  . Muscle weakness (generalized)  . S/P hip replacement    PT - End of Session Equipment Utilized During Treatment: Gait belt Activity Tolerance: Patient tolerated treatment well General Behavior During Session: [facility] for tasks performed Cognition: [facility] for tasks performed  GP    Danil Wedge ATKINSO 03/26/2012, 10:15 AM

## 2012-03-29 ENCOUNTER — Ambulatory Visit (HOSPITAL_COMMUNITY)
Admission: RE | Admit: 2012-03-29 | Discharge: 2012-03-29 | Disposition: A | Discharge status: Home / Self Care | Payer: Medicare Other | Source: Ambulatory Visit | Attending: Pulmonary Disease | Admitting: Pulmonary Disease

## 2012-03-29 NOTE — Progress Notes (Signed)
Physical Therapy Treatment Patient Details  Name: Malik Nicholson MRN: [id number] Date of Birth: [birth year]  Today's Date: 03/29/2012 Time: 0840-0918 PT Time Calculation (min): 38 min  Visit#: 17  of 18   Re-eval: 03/30/12 Diagnosis: L THR Surgical Date: 02/02/12 Next MD Visit: 04/27/2012 Authorization: [facility] Medicare  Authorization Visit#: 17  of 20   Charges:  Therex 8', gait 10', NMR 20'  Subjective: Symptoms/Limitations Symptoms: Pt. states he can tell it's getting much better because he's able to stand on his L LE to put his pants on his R LE.  No pain reported today. Pain Assessment Currently in Pain?: No/denies   Exercise/Treatments Aerobic Tread Mill: 1.5 mph @ incline 1 x10 minutes w/min-mod verbal and sacral-sternal cueing for posture to encourage activtiy tolerance and decrease antalgic gait.  Machines for Strengthening Cybex Knee Extension: 4.5 PL x20 BLE, 5 reps L only Cybex Knee Flexion: 6.5 PL x20 BLE, x5 LLE Standing SLS: max of 3:  R: 12", L: 5" Gait Training: balance beam 4RT Other Standing Knee Exercises: hip abduction 10 reps B LE with stab of hips Other Standing Knee Exercises: hurdles 6"/12" alternating 4RT    Physical Therapy Assessment and Plan PT Assessment and Plan Clinical Impression Statement: Pt. progressing well with balance activites, ADL's and overall gait.  Less cues needed with gait but with noted shuffling of BLE when fatigued.  Tends to circumduct R LE, added standing hip abduction to strengthen for B LE's.  Also added hurdle tasks with minimal, self corrected LOB. PT Plan: continue PT plan of care.  Re-eval next visit.     Problem List Patient Active Problem List  Diagnosis  . ANEMIA, NORMOCYTIC  . OSTEOARTHRITIS, HIP, LEFT  . OSTEOARTHRITIS, KNEE, LEFT  . JOINT EFFUSION, LEFT KNEE  . DEGENERATIVE DISC DISEASE, LUMBAR SPINE  . DYSPHAGIA UNSPECIFIED  . Mononeuritis leg  . DDD (degenerative disc disease)  . Osteoarthritis of hip    . DDD (degenerative disc disease), lumbosacral  . Spinal stenosis  . OA (osteoarthritis) of hip  . S/P total hip arthroplasty  . Difficulty in walking  . Muscle weakness (generalized)  . S/P hip replacement    PT - End of Session Equipment Utilized During Treatment: Gait belt Activity Tolerance: Patient tolerated treatment well General Behavior During Session: [facility] for tasks performed Cognition: [facility] for tasks performed   Lurena Nida, PTA/CLT 03/29/2012, 9:20 AM

## 2012-03-31 ENCOUNTER — Ambulatory Visit (HOSPITAL_COMMUNITY)
Admission: RE | Admit: 2012-03-31 | Discharge: 2012-03-31 | Disposition: A | Discharge status: Home / Self Care | Payer: Medicare Other | Source: Ambulatory Visit | Attending: Orthopedic Surgery | Admitting: Orthopedic Surgery

## 2012-03-31 NOTE — Evaluation (Signed)
Physical Therapy Discharge Summary  Patient Details  Name: Malik Nicholson MRN: [id number] Date of Birth: 26-Apr-1941  Today's Date: 03/31/2012 Time: 0839-0920 PT Time Calculation (min): 41 min Charges: 1 MMT, 15' TE, 15' Self Care  Visit#: 91  of 18   Re-eval: 03/30/12 Assessment Diagnosis: L THR Surgical Date: 02/02/12 Next MD Visit: Dr. Romeo Apple 04/27/12  Authorization: [facility] Medicare  Authorization Time Period:    Authorization Visit#: 18  of 20    Past Medical History:  Past Medical History  Diagnosis Date  . High blood pressure   . Sleep apnea     stop bangcock score of 4   Past Surgical History:  Past Surgical History  Procedure Date  . Shoulder surgery   . Hernia repair   . Total hip arthroplasty 02/02/2012    Procedure: TOTAL HIP ARTHROPLASTY;  Surgeon: Vickki Hearing, MD;  Location: [facility];  Service: Orthopedics;  Laterality: Left;   Subjective Symptoms/Limitations Symptoms: Pt reports that he has been going to the gym and was able to use the TM at 1.5 MPH.  he reports he is at least 80% better.  Pain Assessment Currently in Pain?: No/denies  Sensation/Coordination/Flexibility/Functional Tests Functional Tests Functional Tests: ABC: 88% (was 83) Functional Tests: 30 sec STS 8x (was 3)  Assessment LLE Strength Left Hip Flexion: 5/5 Left Hip Extension: 5/5 (was 4/5) Left Hip ABduction: 3+/5 Left Knee Flexion: 5/5 Left Knee Extension: 5/5  Exercise/Treatments Mobility/Balance  Static Standing Balance Single Leg Stance - Right Leg: 23  (was 3) Single Leg Stance - Left Leg: 8  (was 2) Tandem Stance - Right Leg: 30  Tandem Stance - Left Leg: 30  Timed Up and Go Test TUG: Normal TUG Normal TUG (seconds): 10  (independent was 15.5 sec independent)   Aerobic Tread Mill: 1.5 mph @ incline 2 x10 minutes w/min-mod verbal and sacral-sternal cueing for posture to encourage activtiy tolerance and decrease antalgic gait.  S/L Active Assistance L hip  abduction x10   Physical Therapy Assessment and Plan PT Assessment and Plan Clinical Impression Statement: Mr. Fedie attended 21 OP PT visits s/p L THR.  He has met all goals and is to be D/C from PT w/advanced HEP. Continues to have moderate Trendelenberg gait with independent ambulation, which is corrected with SPC.  Pt will continue to improve overall strength as he is planning on attending a gym in his area.   PT Plan: D/C    Goals Home Exercise Program Pt will Perform Home Exercise Program: Independently PT Goal: Perform Home Exercise Program - Progress: Progressing toward goal (Pt will be attending the gym on a regular basis) PT Short Term Goals Time to Complete Short Term Goals: 3 weeks PT Short Term Goal 1: Pt will report pain less than 2/17for 75% of his day to his L hip.  PT Short Term Goal 1 - Progress: Met PT Short Term Goal 2: Pt will ambulate x10 minutes w/SPC indoor and outdoor enviroments w/supervision.  PT Short Term Goal 2 - Progress: Met PT Short Term Goal 3: Pt will improve LE strength by 1 muscle grade and demonstrate appropriate L SLR without extension lag.  PT Short Term Goal 3 - Progress: Met PT Short Term Goal 4: Pt will improve LE coordination and perform a funcitonal squat without favoring LLE.  PT Short Term Goal 4 - Progress: Met PT Long Term Goals Time to Complete Long Term Goals: Other (comment) (6 weeks. ) PT Long Term Goal 1: Pt will  improve his TUG time with a SPC to less than or equal to 13 seconds for improved safey with ambulating in the community.  PT Long Term Goal 2: Pt will improve LE functional strength and demonstrate 5 STS in 30 seconds and ascend and descend 5 stairs w/1 handrail and supervision in order to enter community dwellings.  Long Term Goal 3: Pt will improve his ABC to 80% for improved percieved funcitonal ability with balance.   Problem List Patient Active Problem List  Diagnosis  . ANEMIA, NORMOCYTIC  . OSTEOARTHRITIS, HIP, LEFT   . OSTEOARTHRITIS, KNEE, LEFT  . JOINT EFFUSION, LEFT KNEE  . DEGENERATIVE DISC DISEASE, LUMBAR SPINE  . DYSPHAGIA UNSPECIFIED  . Mononeuritis leg  . DDD (degenerative disc disease)  . Osteoarthritis of hip  . DDD (degenerative disc disease), lumbosacral  . Spinal stenosis  . OA (osteoarthritis) of hip  . S/P total hip arthroplasty  . Difficulty in walking  . Muscle weakness (generalized)  . S/P hip replacement    PT - End of Session Equipment Utilized During Treatment: Gait belt Activity Tolerance: Patient tolerated treatment well General Behavior During Session: [facility] for tasks performed Cognition: [facility] for tasks performed PT Plan of Care PT Home Exercise Plan: updated w/advanced strengthening and balance activities.  Discussed in great detail about continuing with strengthening activities and encouraged to ambulate with appropriate mechanics.  Discussed ABC Consulted and Agree with Plan of Care: Patient  GP Functional Limitation: Mobility: Walking and moving around Mobility: Walking and Moving Around Current Status [id number]): At least 1 percent but less than 20 percent impaired, limited or restricted Mobility: Walking and Moving Around Goal Status [phone]): At least 1 percent but less than 20 percent impaired, limited or restricted Mobility: Walking and Moving Around Discharge Status [phone]): At least 1 percent but less than 20 percent impaired, limited or restricted  Michole Lecuyer, PT 03/31/2012, 9:39 AM  Physician Documentation Your signature is required to indicate approval of the treatment plan as stated above.  Please sign and either send electronically or make a copy of this report for your files and return this physician signed original.   Please mark one 1.__approve of plan  2. ___approve of plan with the following conditions.   ______________________________                                                          _____________________ Physician Signature                                                                                                              Date

## 2012-04-02 ENCOUNTER — Ambulatory Visit (HOSPITAL_COMMUNITY): Payer: Medicare Other | Admitting: *Deleted

## 2012-04-05 ENCOUNTER — Ambulatory Visit (HOSPITAL_COMMUNITY): Payer: Medicare Other | Admitting: Physical Therapy

## 2012-04-07 ENCOUNTER — Ambulatory Visit (HOSPITAL_COMMUNITY): Payer: Medicare Other | Admitting: Physical Therapy

## 2012-04-09 ENCOUNTER — Ambulatory Visit (HOSPITAL_COMMUNITY): Payer: Medicare Other | Admitting: Physical Therapy

## 2012-04-27 ENCOUNTER — Encounter: Payer: Self-pay | Admitting: Orthopedic Surgery

## 2012-04-27 ENCOUNTER — Ambulatory Visit (INDEPENDENT_AMBULATORY_CARE_PROVIDER_SITE_OTHER): Payer: Medicare Other | Admitting: Orthopedic Surgery

## 2012-04-27 VITALS — Ht 71.0 in | Wt 215.0 lb

## 2012-04-27 DIAGNOSIS — M1710 Unilateral primary osteoarthritis, unspecified knee: Secondary | ICD-10-CM

## 2012-04-27 DIAGNOSIS — Z96649 Presence of unspecified artificial hip joint: Secondary | ICD-10-CM

## 2012-04-27 NOTE — Progress Notes (Signed)
Patient ID: ANTHANY POLIT, male   DOB: 02/07/1941, 71 y.o.   MRN: [id number] Chief Complaint  Patient presents with  . Follow-up    6 week recheck on left THA. (02-02-2012)   1. Osteoarthrosis, unspecified whether generalized or localized, lower leg     #2 status post left total hip arthroplasty  The patient is now ambulatory without assistive device he has some soreness is in his left hip but his wound looks good. His toe complaining of his left knee which is continued to hurt. I offered him injection he accepted and we injected the left knee joint.  Continue current management including physical therapy. Return in 6-8 weeks. No x-rays needed.  Knee  Injection Procedure Note  Pre-operative Diagnosis: left knee oa  Post-operative Diagnosis: same  Indications: pain  Anesthesia: ethyl chloride   Procedure Details   Verbal consent was obtained for the procedure. Time out was completed.The joint was prepped with alcohol, followed by  Ethyl chloride spray and A 20 gauge needle was inserted into the knee via lateral approach; 4ml 1% lidocaine and 1 ml of depomedrol  was then injected into the joint . The needle was removed and the area cleansed and dressed.  Complications:  None; patient tolerated the procedure well.

## 2012-04-27 NOTE — Patient Instructions (Addendum)
Hinged knee brace from [employer] have received a steroid shot. 15% of patients experience increased pain at the injection site with in the next 24 hours. This is best treated with ice and tylenol extra strength 2 tabs every 8 hours. If you are still having pain please call the office.

## 2012-06-08 ENCOUNTER — Ambulatory Visit (INDEPENDENT_AMBULATORY_CARE_PROVIDER_SITE_OTHER): Payer: Medicare Other | Admitting: Orthopedic Surgery

## 2012-06-08 ENCOUNTER — Encounter: Payer: Self-pay | Admitting: Orthopedic Surgery

## 2012-06-08 VITALS — BP 140/80 | Ht 71.0 in | Wt 215.0 lb

## 2012-06-08 DIAGNOSIS — Z96649 Presence of unspecified artificial hip joint: Secondary | ICD-10-CM

## 2012-06-08 NOTE — Progress Notes (Signed)
Patient ID: Malik Nicholson, male   DOB: 18-Mar-1941, 71 y.o.   MRN: [id number] Chief Complaint  Patient presents with  . Follow-up    12 week recheck on left hip replacement DOS 02/02/12    Patient doing well status post his left total hip arthroplasty  This is his 12 week followup evaluation  He is having no trouble with his knee at this time he's ambulatory without assistive device. He does not have a limp. Is not using cane. He has excellent flexion and is hip as well as his knee with no swelling in his knee  Followup one-year anniversary

## 2012-06-08 NOTE — Patient Instructions (Addendum)
activities as tolerated 

## 2012-07-26 ENCOUNTER — Encounter (HOSPITAL_COMMUNITY): Payer: Self-pay | Admitting: Pharmacy Technician

## 2012-07-27 ENCOUNTER — Encounter (HOSPITAL_COMMUNITY): Payer: Self-pay

## 2012-07-27 ENCOUNTER — Encounter (HOSPITAL_COMMUNITY)
Admission: RE | Admit: 2012-07-27 | Discharge: 2012-07-27 | Disposition: A | Discharge status: Home / Self Care | Payer: Medicare Other | Source: Ambulatory Visit | Attending: Ophthalmology | Admitting: Ophthalmology

## 2012-07-27 HISTORY — DX: Unspecified osteoarthritis, unspecified site: M19.90

## 2012-07-27 LAB — BASIC METABOLIC PANEL
Calcium: 9 mg/dL (ref 8.4–10.5)
Creatinine, Ser: 1.01 mg/dL (ref 0.50–1.35)
GFR calc Af Amer: 84 mL/min — ABNORMAL LOW (ref >90)

## 2012-07-27 LAB — HEMOGLOBIN AND HEMATOCRIT, BLOOD: Hemoglobin: 14 g/dL (ref 13.0–17.0)

## 2012-07-27 NOTE — Patient Instructions (Addendum)
Your procedure is scheduled on: 07/29/2012  Report to [facility] at  1240     AM.  Call this number if you have problems the morning of surgery: [phone]   Do not eat food or drink liquids :After Midnight.      Take these medicines the morning of surgery with A SIP OF WATER:neurontin,cozaar   Do not wear jewelry, make-up or nail polish.  Do not wear lotions, powders, or perfumes.   Do not shave 48 hours prior to surgery.  Do not bring valuables to the hospital.  Contacts, dentures or bridgework may not be worn into surgery.  Leave suitcase in the car. After surgery it may be brought to your room.  For patients admitted to the hospital, checkout time is 11:00 AM the day of discharge.   Patients discharged the day of surgery will not be allowed to drive home.  :     Please read over the following fact sheets that you were given: Coughing and Deep Breathing, Surgical Site Infection Prevention, Anesthesia Post-op Instructions and Care and Recovery After Surgery    Cataract A cataract is a clouding of the lens of the eye. When a lens becomes cloudy, vision is reduced based on the degree and nature of the clouding. Many cataracts reduce vision to some degree. Some cataracts make people more near-sighted as they develop. Other cataracts increase glare. Cataracts that are ignored and become worse can sometimes look white. The white color can be seen through the pupil. CAUSES   Aging. However, cataracts may occur at any age, even in newborns.   Certain drugs.   Trauma to the eye.   Certain diseases such as diabetes.   Specific eye diseases such as chronic inflammation inside the eye or a sudden attack of a rare form of glaucoma.   Inherited or acquired medical problems.  SYMPTOMS   Gradual, progressive drop in vision in the affected eye.   Severe, rapid visual loss. This most often happens when trauma is the cause.  DIAGNOSIS  To detect a cataract, an eye doctor examines the lens.  Cataracts are best diagnosed with an exam of the eyes with the pupils enlarged (dilated) by drops.  TREATMENT  For an early cataract, vision may improve by using different eyeglasses or stronger lighting. If that does not help your vision, surgery is the only effective treatment. A cataract needs to be surgically removed when vision loss interferes with your everyday activities, such as driving, reading, or watching TV. A cataract may also have to be removed if it prevents examination or treatment of another eye problem. Surgery removes the cloudy lens and usually replaces it with a substitute lens (intraocular lens, IOL).  At a time when both you and your doctor agree, the cataract will be surgically removed. If you have cataracts in both eyes, only one is usually removed at a time. This allows the operated eye to heal and be out of danger from any possible problems after surgery (such as infection or poor wound healing). In rare cases, a cataract may be doing damage to your eye. In these cases, your caregiver may advise surgical removal right away. The vast majority of people who have cataract surgery have better vision afterward. HOME CARE INSTRUCTIONS  If you are not planning surgery, you may be asked to do the following:  Use different eyeglasses.   Use stronger or brighter lighting.   Ask your eye doctor about reducing your medicine dose or changing  medicines if it is thought that a medicine caused your cataract. Changing medicines does not make the cataract go away on its own.   Become familiar with your surroundings. Poor vision can lead to injury. Avoid bumping into things on the affected side. You are at a higher risk for tripping or falling.   Exercise extreme care when driving or operating machinery.   Wear sunglasses if you are sensitive to bright light or experiencing problems with glare.  SEEK IMMEDIATE MEDICAL CARE IF:   You have a worsening or sudden vision loss.   You notice  redness, swelling, or increasing pain in the eye.   You have a fever.  Document Released: 06/09/2005 Document Revised: 05/29/2011 Document Reviewed: 01/31/2011 [facility] Patient Information 2012 [place], [place].PATIENT INSTRUCTIONS POST-ANESTHESIA  IMMEDIATELY FOLLOWING SURGERY:  Do not drive or operate machinery for the first twenty four hours after surgery.  Do not make any important decisions for twenty four hours after surgery or while taking narcotic pain medications or sedatives.  If you develop intractable nausea and vomiting or a severe headache please notify your doctor immediately.  FOLLOW-UP:  Please make an appointment with your surgeon as instructed. You do not need to follow up with anesthesia unless specifically instructed to do so.  WOUND CARE INSTRUCTIONS (if applicable):  Keep a dry clean dressing on the anesthesia/puncture wound site if there is drainage.  Once the wound has quit draining you may leave it open to air.  Generally you should leave the bandage intact for twenty four hours unless there is drainage.  If the epidural site drains for more than 36-48 hours please call the anesthesia department.  QUESTIONS?:  Please feel free to call your physician or the hospital operator if you have any questions, and they will be happy to assist you.

## 2012-07-29 ENCOUNTER — Encounter (HOSPITAL_COMMUNITY)
Admission: RE | Disposition: A | Discharge status: Home / Self Care | Payer: Self-pay | Source: Ambulatory Visit | Attending: Ophthalmology

## 2012-07-29 ENCOUNTER — Encounter (HOSPITAL_COMMUNITY): Payer: Self-pay | Admitting: *Deleted

## 2012-07-29 ENCOUNTER — Ambulatory Visit (HOSPITAL_COMMUNITY): Payer: Medicare Other | Admitting: Anesthesiology

## 2012-07-29 ENCOUNTER — Encounter (HOSPITAL_COMMUNITY): Payer: Self-pay | Admitting: Anesthesiology

## 2012-07-29 ENCOUNTER — Ambulatory Visit (HOSPITAL_COMMUNITY)
Admission: RE | Admit: 2012-07-29 | Discharge: 2012-07-29 | Disposition: A | Discharge status: Home / Self Care | Payer: Medicare Other | Source: Ambulatory Visit | Attending: Ophthalmology | Admitting: Ophthalmology

## 2012-07-29 DIAGNOSIS — H2589 Other age-related cataract: Secondary | ICD-10-CM | POA: Insufficient documentation

## 2012-07-29 DIAGNOSIS — I10 Essential (primary) hypertension: Secondary | ICD-10-CM | POA: Insufficient documentation

## 2012-07-29 DIAGNOSIS — Z79899 Other long term (current) drug therapy: Secondary | ICD-10-CM | POA: Insufficient documentation

## 2012-07-29 DIAGNOSIS — Z01812 Encounter for preprocedural laboratory examination: Secondary | ICD-10-CM | POA: Insufficient documentation

## 2012-07-29 HISTORY — PX: CATARACT EXTRACTION W/PHACO: SHX586

## 2012-07-29 SURGERY — PHACOEMULSIFICATION, CATARACT, WITH IOL INSERTION
Anesthesia: Monitor Anesthesia Care | Site: Eye | Laterality: Right | Wound class: Clean

## 2012-07-29 MED ORDER — MIDAZOLAM HCL 2 MG/2ML IJ SOLN
1.0000 mg | INTRAMUSCULAR | Status: DC | PRN
  Administered 2012-07-29: 2 mg via INTRAVENOUS

## 2012-07-29 MED ORDER — LIDOCAINE HCL (PF) 1 % IJ SOLN
INTRAMUSCULAR | Status: AC
  Filled 2012-07-29: qty 2

## 2012-07-29 MED ORDER — TETRACAINE HCL 0.5 % OP SOLN
OPHTHALMIC | Status: AC
  Filled 2012-07-29: qty 2

## 2012-07-29 MED ORDER — NEOMYCIN-POLYMYXIN-DEXAMETH 3.5-10000-0.1 OP OINT
TOPICAL_OINTMENT | OPHTHALMIC | Status: AC
  Filled 2012-07-29: qty 3.5

## 2012-07-29 MED ORDER — CYCLOPENTOLATE-PHENYLEPHRINE 0.2-1 % OP SOLN
1.0000 [drp] | OPHTHALMIC | Status: AC
  Administered 2012-07-29 (×3): 1 [drp] via OPHTHALMIC

## 2012-07-29 MED ORDER — PROVISC 10 MG/ML IO SOLN
INTRAOCULAR | Status: DC | PRN
  Administered 2012-07-29: 8.5 mg via INTRAOCULAR

## 2012-07-29 MED ORDER — LIDOCAINE HCL (PF) 1 % IJ SOLN
INTRAMUSCULAR | Status: DC | PRN
  Administered 2012-07-29: .5 mL

## 2012-07-29 MED ORDER — PHENYLEPHRINE HCL 2.5 % OP SOLN
OPHTHALMIC | Status: AC
  Filled 2012-07-29: qty 2

## 2012-07-29 MED ORDER — LACTATED RINGERS IV SOLN
INTRAVENOUS | Status: DC
  Administered 2012-07-29: 1000 mL via INTRAVENOUS

## 2012-07-29 MED ORDER — LIDOCAINE HCL 3.5 % OP GEL
1.0000 "application " | Freq: Once | OPHTHALMIC | Status: AC
  Administered 2012-07-29: 1 via OPHTHALMIC

## 2012-07-29 MED ORDER — EPINEPHRINE HCL 1 MG/ML IJ SOLN
INTRAOCULAR | Status: DC | PRN
  Administered 2012-07-29: 14:00:00

## 2012-07-29 MED ORDER — PHENYLEPHRINE HCL 2.5 % OP SOLN
1.0000 [drp] | OPHTHALMIC | Status: AC
  Administered 2012-07-29 (×3): 1 [drp] via OPHTHALMIC

## 2012-07-29 MED ORDER — BSS IO SOLN
INTRAOCULAR | Status: DC | PRN
  Administered 2012-07-29: 15 mL via INTRAOCULAR

## 2012-07-29 MED ORDER — LIDOCAINE 3.5 % OP GEL OPTIME - NO CHARGE
OPHTHALMIC | Status: DC | PRN
  Administered 2012-07-29: 1 [drp] via OPHTHALMIC

## 2012-07-29 MED ORDER — MIDAZOLAM HCL 2 MG/2ML IJ SOLN
INTRAMUSCULAR | Status: AC
  Filled 2012-07-29: qty 2

## 2012-07-29 MED ORDER — TETRACAINE HCL 0.5 % OP SOLN
1.0000 [drp] | OPHTHALMIC | Status: AC
  Administered 2012-07-29 (×3): 1 [drp] via OPHTHALMIC

## 2012-07-29 MED ORDER — LIDOCAINE HCL 3.5 % OP GEL
OPHTHALMIC | Status: AC
  Filled 2012-07-29: qty 5

## 2012-07-29 MED ORDER — CYCLOPENTOLATE-PHENYLEPHRINE 0.2-1 % OP SOLN
OPHTHALMIC | Status: AC
  Filled 2012-07-29: qty 2

## 2012-07-29 MED ORDER — NEOMYCIN-POLYMYXIN-DEXAMETH 0.1 % OP OINT
TOPICAL_OINTMENT | OPHTHALMIC | Status: DC | PRN
  Administered 2012-07-29: 1 via OPHTHALMIC

## 2012-07-29 MED ORDER — POVIDONE-IODINE 5 % OP SOLN
OPHTHALMIC | Status: DC | PRN
  Administered 2012-07-29: 1 via OPHTHALMIC

## 2012-07-29 SURGICAL SUPPLY — 32 items
CAPSULAR TENSION RING-AMO (OPHTHALMIC RELATED) IMPLANT
CLOTH BEACON ORANGE TIMEOUT ST (SAFETY) IMPLANT
EYE SHIELD UNIVERSAL CLEAR (GAUZE/BANDAGES/DRESSINGS) ×2 IMPLANT
GLOVE BIO SURGEON STRL SZ 6.5 (GLOVE) ×2 IMPLANT
GLOVE BIOGEL PI IND STRL 6.5 (GLOVE) IMPLANT
GLOVE BIOGEL PI IND STRL 7.0 (GLOVE) IMPLANT
GLOVE BIOGEL PI IND STRL 7.5 (GLOVE) IMPLANT
GLOVE BIOGEL PI INDICATOR 6.5 (GLOVE)
GLOVE BIOGEL PI INDICATOR 7.0 (GLOVE)
GLOVE BIOGEL PI INDICATOR 7.5 (GLOVE)
GLOVE ECLIPSE 6.5 STRL STRAW (GLOVE) IMPLANT
GLOVE ECLIPSE 7.0 STRL STRAW (GLOVE) IMPLANT
GLOVE ECLIPSE 7.5 STRL STRAW (GLOVE) IMPLANT
GLOVE EXAM NITRILE LRG STRL (GLOVE) IMPLANT
GLOVE EXAM NITRILE MD LF STRL (GLOVE) ×2 IMPLANT
GLOVE SKINSENSE NS SZ6.5 (GLOVE)
GLOVE SKINSENSE NS SZ7.0 (GLOVE)
GLOVE SKINSENSE STRL SZ6.5 (GLOVE) IMPLANT
GLOVE SKINSENSE STRL SZ7.0 (GLOVE) IMPLANT
KIT VITRECTOMY (OPHTHALMIC RELATED) IMPLANT
PAD ARMBOARD 7.5X6 YLW CONV (MISCELLANEOUS) ×2 IMPLANT
PROC W NO LENS (INTRAOCULAR LENS)
PROC W SPEC LENS (INTRAOCULAR LENS)
PROCESS W NO LENS (INTRAOCULAR LENS) IMPLANT
PROCESS W SPEC LENS (INTRAOCULAR LENS) IMPLANT
RING MALYGIN (MISCELLANEOUS) IMPLANT
SIGHTPATH CAT PROC W REG LENS (Ophthalmic Related) ×2 IMPLANT
SYR TB 1ML LL NO SAFETY (SYRINGE) ×2 IMPLANT
TAPE SURG TRANSPORE 1 IN (GAUZE/BANDAGES/DRESSINGS) ×1 IMPLANT
TAPE SURGICAL TRANSPORE 1 IN (GAUZE/BANDAGES/DRESSINGS) ×1
VISCOELASTIC ADDITIONAL (OPHTHALMIC RELATED) IMPLANT
WATER STERILE IRR 250ML POUR (IV SOLUTION) ×2 IMPLANT

## 2012-07-29 NOTE — Anesthesia Preprocedure Evaluation (Addendum)
Anesthesia Evaluation  Patient identified by MRN, date of birth, ID band Patient awake    Reviewed: Allergy & Precautions, H&P , NPO status , Patient's Chart, lab work & pertinent test results  History of Anesthesia Complications Negative for: history of anesthetic complications  Airway Mallampati: II      Dental  (+) Teeth Intact   Pulmonary neg pulmonary ROS,          Cardiovascular hypertension, Pt. on medications Rhythm:Regular Rate:Normal     Neuro/Psych  Neuromuscular disease    GI/Hepatic   Endo/Other    Renal/GU      Musculoskeletal   Abdominal   Peds  Hematology   Anesthesia Other Findings   Reproductive/Obstetrics                           Anesthesia Physical Anesthesia Plan  ASA: II  Anesthesia Plan: MAC   Post-op Pain Management:    Induction:   Airway Management Planned: Nasal Cannula  Additional Equipment:   Intra-op Plan:   Post-operative Plan:   Informed Consent: I have reviewed the patients History and Physical, chart, labs and discussed the procedure including the risks, benefits and alternatives for the proposed anesthesia with the patient or authorized representative who has indicated his/her understanding and acceptance.     Plan Discussed with:   Anesthesia Plan Comments:         Anesthesia Quick Evaluation

## 2012-07-29 NOTE — Transfer of Care (Signed)
Immediate Anesthesia Transfer of Care Note  Patient: Malik Nicholson  Procedure(s) Performed: Procedure(s) (LRB) with comments: CATARACT EXTRACTION PHACO AND INTRAOCULAR LENS PLACEMENT (IOC) (Right) - CDE:24.36  Patient Location: Short Stay  Anesthesia Type:MAC  Level of Consciousness: awake, alert , oriented and patient cooperative  Airway & Oxygen Therapy: Patient Spontanous Breathing  Post-op Assessment: Report given to PACU RN, Post -op Vital signs reviewed and stable and Patient moving all extremities  Post vital signs: Reviewed and stable  Complications: No apparent anesthesia complications

## 2012-07-29 NOTE — Anesthesia Postprocedure Evaluation (Signed)
  Anesthesia Post-op Note  Patient: Malik Nicholson  Procedure(s) Performed: Procedure(s) (LRB) with comments: CATARACT EXTRACTION PHACO AND INTRAOCULAR LENS PLACEMENT (IOC) (Right) - CDE:24.36  Patient Location: Short Stay  Anesthesia Type:MAC  Level of Consciousness: awake, alert , oriented and patient cooperative  Airway and Oxygen Therapy: Patient Spontanous Breathing  Post-op Pain: none  Post-op Assessment: Post-op Vital signs reviewed, Patient's Cardiovascular Status Stable, Respiratory Function Stable, Patent Airway, No signs of Nausea or vomiting and Adequate PO intake  Post-op Vital Signs: Reviewed and stable  Complications: No apparent anesthesia complications

## 2012-07-29 NOTE — H&P (Signed)
I have reviewed the H&P, the patient was re-examined, and I have identified no interval changes in medical condition and plan of care since the history and physical of record  

## 2012-07-29 NOTE — Brief Op Note (Signed)
Pre-Op Dx: Cataract OD Post-Op Dx: Cataract OD Surgeon: Sangeeta Youse Anesthesia: Topical with MAC Surgery: Cataract Extraction with Intraocular lens Implant OD Implant: B&L enVista Specimen: None Complications: None 

## 2012-07-30 ENCOUNTER — Encounter (HOSPITAL_COMMUNITY): Payer: Self-pay | Admitting: Ophthalmology

## 2012-07-30 NOTE — Op Note (Signed)
NAMEBRYOR, RAMI NO.:  [id number]  MEDICAL RECORD NO.:  [id number]  LOCATION:  [facility]                          FACILITY:  [facility]  PHYSICIAN:  Susanne Greenhouse, MD       DATE OF BIRTH:  1941/01/26  DATE OF PROCEDURE:  07/29/2012 DATE OF DISCHARGE:  07/29/2012                              OPERATIVE REPORT   PREOPERATIVE DIAGNOSIS:  Combined cataract, right eye, diagnosis code 366.19.  POSTOPERATIVE DIAGNOSIS:  Combined cataract, right eye, diagnosis code 366.19.  OPERATION PERFORMED:  Phacoemulsification with posterior chamber intraocular lens implantation, right eye.  SURGEON:  Bonne Dolores. Jimy Gates, MD  ANESTHESIA:  Topical with monitored anesthesia care and IV sedation.  OPERATIVE SUMMARY:  In the preoperative area, dilating drops were placed into the right eye.  The patient was then brought into the operating room where he was placed under general anesthesia.  The eye was then prepped and draped.  Beginning with a 75 blade, a paracentesis port was made at the surgeon's 2 o'clock position.  The anterior chamber was then filled with a 1% nonpreserved lidocaine solution with epinephrine.  This was followed by Viscoat to deepen the chamber.  A small fornix-based peritomy was performed superiorly.  Next, a single iris hook was placed through the limbus superiorly.  A 2.4-mm keratome blade was then used to make a clear corneal incision over the iris hook.  A bent cystotome needle and Utrata forceps were used to create a continuous tear capsulotomy.  Hydrodissection was performed using balanced salt solution on a fine cannula.  The lens nucleus was then removed using phacoemulsification in a quadrant cracking technique.  The cortical material was then removed with irrigation and aspiration.  The capsular bag and anterior chamber were refilled with Provisc.  The wound was widened to approximately 3 mm and a posterior chamber intraocular lens was placed into the capsular  bag without difficulty using an [employer] lens injecting system.  A single 10-0 nylon suture was then used to close the incision as well as stromal hydration.  The Provisc was removed from the anterior chamber and capsular bag with irrigation and aspiration.  At this point, the wounds were tested for leak, which were negative.  The anterior chamber remained deep and stable.  The patient tolerated the procedure well.  There were no operative complications.  No surgical specimens.  Prosthetic device used Bausch and Lomb posterior chamber lens, model Envista, model number MX60, power of 20.5, serial number is [id number].          ______________________________ Susanne Greenhouse, MD     KEH/MEDQ  D:  07/29/2012  T:  07/30/2012  Job:  [id number]

## 2012-12-06 ENCOUNTER — Other Ambulatory Visit (HOSPITAL_COMMUNITY): Payer: Self-pay | Admitting: Family Medicine

## 2012-12-06 ENCOUNTER — Ambulatory Visit (HOSPITAL_COMMUNITY)
Admission: RE | Admit: 2012-12-06 | Discharge: 2012-12-06 | Disposition: A | Discharge status: Home / Self Care | Payer: Medicare Other | Source: Ambulatory Visit | Attending: Family Medicine | Admitting: Family Medicine

## 2012-12-06 DIAGNOSIS — F17200 Nicotine dependence, unspecified, uncomplicated: Secondary | ICD-10-CM

## 2012-12-06 DIAGNOSIS — Z87891 Personal history of nicotine dependence: Secondary | ICD-10-CM | POA: Insufficient documentation

## 2013-02-08 ENCOUNTER — Ambulatory Visit (INDEPENDENT_AMBULATORY_CARE_PROVIDER_SITE_OTHER): Payer: Medicare Other | Admitting: Orthopedic Surgery

## 2013-02-08 ENCOUNTER — Encounter: Payer: Self-pay | Admitting: Orthopedic Surgery

## 2013-02-08 ENCOUNTER — Ambulatory Visit (INDEPENDENT_AMBULATORY_CARE_PROVIDER_SITE_OTHER): Payer: Medicare Other

## 2013-02-08 VITALS — BP 150/84 | Ht 71.0 in | Wt 209.0 lb

## 2013-02-08 DIAGNOSIS — Z96649 Presence of unspecified artificial hip joint: Secondary | ICD-10-CM

## 2013-02-08 NOTE — Patient Instructions (Signed)
F/u in 1 year for xrays   activities as tolerated

## 2013-02-08 NOTE — Progress Notes (Signed)
Subjective:     Patient ID: Malik Nicholson, male   DOB: [birth year], 72 y.o.   MRN: [id number]  Chief Complaint  Patient presents with  . Follow-up    one year follow up left hip, 02/02/2012    HPI One year after total hip The patient is functioning well without any difficulties or problems at this time  Review of Systems He was having knee problems no pain at this time to warrant any intervention    Objective:   Physical Exam BP 150/84  Ht 5\' 11"  (1.803 m)  Wt 209 lb (94.802 kg)  BMI 29.16 kg/m2 General appearance is normal, the patient is alert and oriented x3 with normal mood and affect. He is ambulating with no assistive devices. 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

## 2013-11-03 ENCOUNTER — Emergency Department (HOSPITAL_COMMUNITY): Payer: Medicare HMO

## 2013-11-03 ENCOUNTER — Emergency Department (HOSPITAL_COMMUNITY)
Admission: EM | Admit: 2013-11-03 | Discharge: 2013-11-03 | Disposition: A | Discharge status: Home / Self Care | Payer: Medicare HMO | Attending: Emergency Medicine | Admitting: Emergency Medicine

## 2013-11-03 ENCOUNTER — Encounter (HOSPITAL_COMMUNITY): Payer: Self-pay | Admitting: Emergency Medicine

## 2013-11-03 DIAGNOSIS — I10 Essential (primary) hypertension: Secondary | ICD-10-CM | POA: Diagnosis not present

## 2013-11-03 DIAGNOSIS — S0003XA Contusion of scalp, initial encounter: Secondary | ICD-10-CM | POA: Diagnosis not present

## 2013-11-03 DIAGNOSIS — S0081XA Abrasion of other part of head, initial encounter: Secondary | ICD-10-CM

## 2013-11-03 DIAGNOSIS — Y92410 Unspecified street and highway as the place of occurrence of the external cause: Secondary | ICD-10-CM | POA: Diagnosis not present

## 2013-11-03 DIAGNOSIS — IMO0002 Reserved for concepts with insufficient information to code with codable children: Secondary | ICD-10-CM | POA: Diagnosis not present

## 2013-11-03 DIAGNOSIS — Z79899 Other long term (current) drug therapy: Secondary | ICD-10-CM | POA: Insufficient documentation

## 2013-11-03 DIAGNOSIS — F10229 Alcohol dependence with intoxication, unspecified: Secondary | ICD-10-CM | POA: Insufficient documentation

## 2013-11-03 DIAGNOSIS — F10929 Alcohol use, unspecified with intoxication, unspecified: Secondary | ICD-10-CM

## 2013-11-03 DIAGNOSIS — Y9389 Activity, other specified: Secondary | ICD-10-CM | POA: Insufficient documentation

## 2013-11-03 DIAGNOSIS — S0083XA Contusion of other part of head, initial encounter: Secondary | ICD-10-CM | POA: Insufficient documentation

## 2013-11-03 DIAGNOSIS — M129 Arthropathy, unspecified: Secondary | ICD-10-CM | POA: Diagnosis not present

## 2013-11-03 DIAGNOSIS — S1093XA Contusion of unspecified part of neck, initial encounter: Secondary | ICD-10-CM

## 2013-11-03 DIAGNOSIS — S0990XA Unspecified injury of head, initial encounter: Secondary | ICD-10-CM | POA: Diagnosis present

## 2013-11-03 LAB — CBC WITH DIFFERENTIAL/PLATELET
BASOS ABS: 0 10*3/uL (ref 0.0–0.1)
BASOS PCT: 0 % (ref 0–1)
EOS ABS: 0.1 10*3/uL (ref 0.0–0.7)
Eosinophils Relative: 1 % (ref 0–5)
HCT: 42.1 % (ref 39.0–52.0)
HEMOGLOBIN: 14.2 g/dL (ref 13.0–17.0)
Lymphocytes Relative: 14 % (ref 12–46)
Lymphs Abs: 1.4 10*3/uL (ref 0.7–4.0)
MCH: 31.1 pg (ref 26.0–34.0)
MCHC: 33.7 g/dL (ref 30.0–36.0)
MCV: 92.1 fL (ref 78.0–100.0)
Monocytes Absolute: 0.5 10*3/uL (ref 0.1–1.0)
Monocytes Relative: 6 % (ref 3–12)
NEUTROS ABS: 7.6 10*3/uL (ref 1.7–7.7)
NEUTROS PCT: 79 % — AB (ref 43–77)
PLATELETS: 274 10*3/uL (ref 150–400)
RBC: 4.57 MIL/uL (ref 4.22–5.81)
RDW: 14.2 % (ref 11.5–15.5)
WBC: 9.6 10*3/uL (ref 4.0–10.5)

## 2013-11-03 LAB — BASIC METABOLIC PANEL
BUN: 14 mg/dL (ref 6–23)
CO2: 17 mEq/L — ABNORMAL LOW (ref 19–32)
Calcium: 8.5 mg/dL (ref 8.4–10.5)
Chloride: 102 mEq/L (ref 96–112)
Creatinine, Ser: 0.93 mg/dL (ref 0.50–1.35)
GFR calc Af Amer: >90 mL/min (ref >90)
GFR calc non Af Amer: 81 mL/min — ABNORMAL LOW (ref >90)
Glucose, Bld: 99 mg/dL (ref 70–99)
Potassium: 4.1 mEq/L (ref 3.7–5.3)
Sodium: 143 mEq/L (ref 137–147)

## 2013-11-03 LAB — HEPATIC FUNCTION PANEL
ALK PHOS: 117 U/L (ref 39–117)
ALT: 25 U/L (ref 0–53)
AST: 59 U/L — ABNORMAL HIGH (ref 0–37)
Albumin: 3.5 g/dL (ref 3.5–5.2)
Total Bilirubin: 0.4 mg/dL (ref 0.3–1.2)
Total Protein: 7 g/dL (ref 6.0–8.3)

## 2013-11-03 LAB — ETHANOL: Alcohol, Ethyl (B): 245 mg/dL — ABNORMAL HIGH (ref 0–11)

## 2013-11-03 MED ORDER — SODIUM CHLORIDE 0.9 % IV SOLN
INTRAVENOUS | Status: DC
  Administered 2013-11-03: 18:00:00 via INTRAVENOUS

## 2013-11-03 MED ORDER — BACITRACIN ZINC 500 UNIT/GM EX OINT
TOPICAL_OINTMENT | CUTANEOUS | Status: AC
  Filled 2013-11-03: qty 1.8

## 2013-11-03 MED ORDER — IOHEXOL 300 MG/ML  SOLN
100.0000 mL | Freq: Once | INTRAMUSCULAR | Status: AC | PRN
  Administered 2013-11-03: 100 mL via INTRAVENOUS

## 2013-11-03 MED ORDER — HYDROCODONE-ACETAMINOPHEN 5-325 MG PO TABS: 1.0000 | ORAL_TABLET | Freq: Four times a day (QID) | ORAL | Status: DC | PRN
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
## 2013-11-03 NOTE — ED Notes (Signed)
Walked patient to restroom.

## 2013-11-03 NOTE — ED Notes (Signed)
Pt accidentally pulled out his IV in Radiology. IV obtained in L ac. Pt ready for transport to CT.
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
## 2014-02-09 ENCOUNTER — Ambulatory Visit: Payer: Medicare Other | Admitting: Orthopedic Surgery

## 2014-02-14 ENCOUNTER — Ambulatory Visit (INDEPENDENT_AMBULATORY_CARE_PROVIDER_SITE_OTHER): Payer: Medicare HMO | Admitting: Orthopedic Surgery

## 2014-02-14 ENCOUNTER — Ambulatory Visit (INDEPENDENT_AMBULATORY_CARE_PROVIDER_SITE_OTHER): Payer: Medicare HMO

## 2014-02-14 VITALS — BP 128/74 | Ht 71.0 in | Wt 195.0 lb

## 2014-02-14 DIAGNOSIS — M1610 Unilateral primary osteoarthritis, unspecified hip: Secondary | ICD-10-CM

## 2014-02-14 NOTE — Progress Notes (Signed)
Chief Complaint  Patient presents with  . Follow-up    Yearly recheck on left total hip replacement. DOS 02-02-12.     History s/p left total hip   2 years postop   General appearance is normal, the patient is alert and oriented x3 with normal mood and affect. 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  Plan: annual xrays in 1 year

## 2014-02-14 NOTE — Patient Instructions (Signed)
Activities as tolerated. 

## 2014-12-05 ENCOUNTER — Encounter: Payer: Self-pay | Admitting: Internal Medicine

## 2015-02-20 ENCOUNTER — Ambulatory Visit: Payer: Medicare HMO | Admitting: Orthopedic Surgery

## 2015-02-20 ENCOUNTER — Encounter: Payer: Self-pay | Admitting: Orthopedic Surgery

## 2015-11-26 ENCOUNTER — Other Ambulatory Visit (HOSPITAL_COMMUNITY): Payer: Self-pay | Admitting: Family Medicine

## 2015-11-26 DIAGNOSIS — Z0001 Encounter for general adult medical examination with abnormal findings: Secondary | ICD-10-CM | POA: Diagnosis not present

## 2015-11-26 DIAGNOSIS — R1909 Other intra-abdominal and pelvic swelling, mass and lump: Secondary | ICD-10-CM

## 2015-11-26 DIAGNOSIS — E7800 Pure hypercholesterolemia, unspecified: Secondary | ICD-10-CM | POA: Diagnosis not present

## 2015-11-26 DIAGNOSIS — I10 Essential (primary) hypertension: Secondary | ICD-10-CM | POA: Diagnosis not present

## 2015-11-26 DIAGNOSIS — R739 Hyperglycemia, unspecified: Secondary | ICD-10-CM | POA: Diagnosis not present

## 2015-11-28 ENCOUNTER — Other Ambulatory Visit (HOSPITAL_COMMUNITY): Payer: Self-pay | Admitting: Family Medicine

## 2015-11-28 ENCOUNTER — Other Ambulatory Visit (HOSPITAL_COMMUNITY): Payer: Self-pay | Admitting: Internal Medicine

## 2015-11-28 DIAGNOSIS — R1909 Other intra-abdominal and pelvic swelling, mass and lump: Secondary | ICD-10-CM

## 2015-12-04 ENCOUNTER — Telehealth: Payer: Self-pay | Admitting: Internal Medicine

## 2015-12-05 ENCOUNTER — Ambulatory Visit (HOSPITAL_COMMUNITY): Admission: RE | Admit: 2015-12-05 | Payer: Commercial Managed Care - HMO | Source: Ambulatory Visit

## 2015-12-12 ENCOUNTER — Ambulatory Visit (HOSPITAL_COMMUNITY)
Admission: RE | Admit: 2015-12-12 | Discharge: 2015-12-12 | Disposition: A | Discharge status: Home / Self Care | Payer: Commercial Managed Care - HMO | Source: Ambulatory Visit | Attending: Internal Medicine | Admitting: Internal Medicine

## 2015-12-12 DIAGNOSIS — R1900 Intra-abdominal and pelvic swelling, mass and lump, unspecified site: Secondary | ICD-10-CM | POA: Diagnosis not present

## 2015-12-12 DIAGNOSIS — K862 Cyst of pancreas: Secondary | ICD-10-CM | POA: Insufficient documentation

## 2015-12-12 DIAGNOSIS — R1909 Other intra-abdominal and pelvic swelling, mass and lump: Secondary | ICD-10-CM | POA: Diagnosis not present

## 2015-12-12 MED ORDER — GADOBENATE DIMEGLUMINE 529 MG/ML IV SOLN
20.0000 mL | Freq: Once | INTRAVENOUS | Status: AC | PRN
  Administered 2015-12-12: 18 mL via INTRAVENOUS

## 2015-12-19 DIAGNOSIS — I10 Essential (primary) hypertension: Secondary | ICD-10-CM | POA: Diagnosis not present

## 2015-12-19 DIAGNOSIS — E785 Hyperlipidemia, unspecified: Secondary | ICD-10-CM | POA: Diagnosis not present

## 2015-12-19 DIAGNOSIS — J302 Other seasonal allergic rhinitis: Secondary | ICD-10-CM | POA: Diagnosis not present

## 2015-12-19 DIAGNOSIS — K862 Cyst of pancreas: Secondary | ICD-10-CM | POA: Diagnosis not present

## 2016-01-08 DIAGNOSIS — K861 Other chronic pancreatitis: Secondary | ICD-10-CM | POA: Diagnosis not present

## 2016-01-09 ENCOUNTER — Other Ambulatory Visit: Payer: Self-pay | Admitting: Gastroenterology

## 2016-01-09 DIAGNOSIS — R933 Abnormal findings on diagnostic imaging of other parts of digestive tract: Secondary | ICD-10-CM | POA: Diagnosis not present

## 2016-01-09 DIAGNOSIS — I10 Essential (primary) hypertension: Secondary | ICD-10-CM | POA: Diagnosis not present

## 2016-01-09 DIAGNOSIS — C250 Malignant neoplasm of head of pancreas: Secondary | ICD-10-CM | POA: Diagnosis not present

## 2016-01-18 ENCOUNTER — Encounter (HOSPITAL_COMMUNITY): Payer: Self-pay | Admitting: *Deleted

## 2016-01-25 ENCOUNTER — Ambulatory Visit (HOSPITAL_COMMUNITY): Payer: Commercial Managed Care - HMO | Admitting: Anesthesiology

## 2016-01-25 ENCOUNTER — Ambulatory Visit (HOSPITAL_COMMUNITY)
Admission: RE | Admit: 2016-01-25 | Discharge: 2016-01-25 | Disposition: A | Discharge status: Home / Self Care | Payer: Commercial Managed Care - HMO | Source: Ambulatory Visit | Attending: Gastroenterology | Admitting: Gastroenterology

## 2016-01-25 ENCOUNTER — Encounter (HOSPITAL_COMMUNITY)
Admission: RE | Disposition: A | Discharge status: Home / Self Care | Payer: Self-pay | Source: Ambulatory Visit | Attending: Gastroenterology

## 2016-01-25 ENCOUNTER — Encounter (HOSPITAL_COMMUNITY): Payer: Self-pay | Admitting: Anesthesiology

## 2016-01-25 DIAGNOSIS — I10 Essential (primary) hypertension: Secondary | ICD-10-CM | POA: Insufficient documentation

## 2016-01-25 DIAGNOSIS — R933 Abnormal findings on diagnostic imaging of other parts of digestive tract: Secondary | ICD-10-CM | POA: Diagnosis not present

## 2016-01-25 DIAGNOSIS — Z96642 Presence of left artificial hip joint: Secondary | ICD-10-CM | POA: Diagnosis not present

## 2016-01-25 DIAGNOSIS — K8689 Other specified diseases of pancreas: Secondary | ICD-10-CM | POA: Diagnosis not present

## 2016-01-25 DIAGNOSIS — Z79899 Other long term (current) drug therapy: Secondary | ICD-10-CM | POA: Diagnosis not present

## 2016-01-25 DIAGNOSIS — Z87891 Personal history of nicotine dependence: Secondary | ICD-10-CM | POA: Insufficient documentation

## 2016-01-25 DIAGNOSIS — Z7951 Long term (current) use of inhaled steroids: Secondary | ICD-10-CM | POA: Insufficient documentation

## 2016-01-25 DIAGNOSIS — K862 Cyst of pancreas: Secondary | ICD-10-CM | POA: Diagnosis not present

## 2016-01-25 HISTORY — DX: Essential (primary) hypertension: I10

## 2016-01-25 HISTORY — PX: EUS: SHX5427

## 2016-01-25 SURGERY — UPPER ENDOSCOPIC ULTRASOUND (EUS) LINEAR
Anesthesia: Monitor Anesthesia Care

## 2016-01-25 MED ORDER — ONDANSETRON HCL 4 MG/2ML IJ SOLN
INTRAMUSCULAR | Status: DC | PRN
  Administered 2016-01-25: 4 mg via INTRAVENOUS

## 2016-01-25 MED ORDER — CIPROFLOXACIN IN D5W 400 MG/200ML IV SOLN
INTRAVENOUS | Status: AC
  Filled 2016-01-25: qty 200

## 2016-01-25 MED ORDER — SODIUM CHLORIDE 0.9 % IV SOLN: INTRAVENOUS | Status: DC

## 2016-01-25 MED ORDER — CIPROFLOXACIN IN D5W 400 MG/200ML IV SOLN
INTRAVENOUS | Status: DC | PRN
  Administered 2016-01-25: 400 mg via INTRAVENOUS

## 2016-01-25 MED ORDER — PROPOFOL 10 MG/ML IV BOLUS
INTRAVENOUS | Status: DC | PRN
  Administered 2016-01-25 (×2): 30 mg via INTRAVENOUS

## 2016-01-25 MED ORDER — PROPOFOL 500 MG/50ML IV EMUL
INTRAVENOUS | Status: DC | PRN
  Administered 2016-01-25: 125 ug/kg/min via INTRAVENOUS

## 2016-01-25 MED ORDER — LIDOCAINE HCL (CARDIAC) 20 MG/ML IV SOLN
INTRAVENOUS | Status: AC
  Filled 2016-01-25: qty 5

## 2016-01-25 MED ORDER — LIDOCAINE HCL (CARDIAC) 20 MG/ML IV SOLN
INTRAVENOUS | Status: DC | PRN
  Administered 2016-01-25: 100 mg via INTRAVENOUS

## 2016-01-25 MED ORDER — CIPROFLOXACIN HCL 500 MG PO TABS
500.0000 mg | ORAL_TABLET | Freq: Two times a day (BID) | ORAL | Status: DC
  Filled 2016-01-25 (×4): qty 1

## 2016-01-25 MED ORDER — LACTATED RINGERS IV SOLN
INTRAVENOUS | Status: DC
  Administered 2016-01-25: 12:00:00 via INTRAVENOUS

## 2016-01-25 MED ORDER — ONDANSETRON HCL 4 MG/2ML IJ SOLN
INTRAMUSCULAR | Status: AC
  Filled 2016-01-25: qty 2

## 2016-01-25 MED ORDER — CIPROFLOXACIN IN D5W 400 MG/200ML IV SOLN: 400.0000 mg | INTRAVENOUS | Status: DC

## 2016-01-25 MED ORDER — PROPOFOL 10 MG/ML IV BOLUS
INTRAVENOUS | Status: AC
Start: 2016-01-25 — End: 2016-01-25
  Filled 2016-01-25: qty 40

## 2016-01-25 NOTE — H&P (Signed)
  Malik Nicholson HPI: The patient was identified to have an enlarging cystic mass.  It may be an IPMN or MCN.  The patient is here today for further evaluation.  Past Medical History:  Diagnosis Date  . Hypertension     Past Surgical History:  Procedure Laterality Date  . CATARACT EXTRACTION W/PHACO  07/29/2012   Procedure: CATARACT EXTRACTION PHACO AND INTRAOCULAR LENS PLACEMENT (IOC);  Surgeon: Tonny Branch, MD;  Location: [facility];  Service: Ophthalmology;  Laterality: Right;  CDE:24.36  . HERNIA REPAIR    . SHOULDER SURGERY Left   . TOTAL HIP ARTHROPLASTY  02/02/2012   Procedure: TOTAL HIP ARTHROPLASTY;  Surgeon: Carole Civil, MD;  Location: [facility];  Service: Orthopedics;  Laterality: Left;    Family History  Problem Relation Age of Onset  . Cancer    . Lung disease    . Diabetes      Social History:  reports that he quit smoking about 18 years ago. His smoking use included Cigarettes. He has a 5.00 pack-year smoking history. He has never used smokeless tobacco. He reports that he drinks about 4.2 oz of alcohol per week . He reports that he does not use drugs.  Allergies:  Allergies  Allergen Reactions  . Lisinopril Swelling    Medications:  Scheduled:  Continuous: . sodium chloride    . lactated ringers 20 mL/hr at 01/25/16 1206    No results found for this or any previous visit (from the past 24 hour(s)).   No results found.  ROS:  As stated above in the HPI otherwise negative.  Blood pressure (!) 163/71, pulse (!) 57, temperature 98.1 F (36.7 C), temperature source Oral, resp. rate 14, height 5\' 11"  (1.803 m), weight 88.5 kg (195 lb), SpO2 100 %.    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  Malik Nicholson D 01/25/2016, 12:08 PM

## 2016-01-25 NOTE — Transfer of Care (Signed)
Immediate Anesthesia Transfer of Care Note  Patient: Malik Nicholson  Procedure(s) Performed: Procedure(s): UPPER ENDOSCOPIC ULTRASOUND (EUS) LINEAR (N/A)  Patient Location: PACU  Anesthesia Type:MAC  Level of Consciousness:  sedated, patient cooperative and responds to stimulation  Airway & Oxygen Therapy:Patient Spontanous Breathing and Patient connected to face mask oxgen  Post-op Assessment:  Report given to PACU RN and Post -op Vital signs reviewed and stable  Post vital signs:  Reviewed and stable  Last Vitals:  Vitals:   01/25/16 1154  BP: (!) 163/71  Pulse: (!) 57  Resp: 14  Temp: 123XX123 C    Complications: No apparent anesthesia complications

## 2016-01-25 NOTE — Anesthesia Postprocedure Evaluation (Signed)
Anesthesia Post Note  Patient: Malik Nicholson  Procedure(s) Performed: Procedure(s) (LRB): UPPER ENDOSCOPIC ULTRASOUND (EUS) LINEAR (N/A)  Patient location during evaluation: PACU Anesthesia Type: MAC Level of consciousness: awake and alert Pain management: pain level controlled Vital Signs Assessment: post-procedure vital signs reviewed and stable Respiratory status: spontaneous breathing, nonlabored ventilation, respiratory function stable and patient connected to nasal cannula oxygen Cardiovascular status: stable and blood pressure returned to baseline Anesthetic complications: no    Last Vitals:  Vitals:   01/25/16 1320 01/25/16 1330  BP: (!) 154/81 (!) 158/85  Pulse: (!) 53 (!) 50  Resp: 15 12  Temp:      Last Pain:  Vitals:   01/25/16 1306  TempSrc: Oral                 Reginal Lutes

## 2016-01-25 NOTE — Anesthesia Preprocedure Evaluation (Addendum)
Anesthesia Evaluation  Patient identified by MRN, date of birth, ID band Patient awake    Reviewed: Allergy & Precautions, NPO status , Patient's Chart, lab work & pertinent test results  History of Anesthesia Complications Negative for: history of anesthetic complications  Airway Mallampati: II  TM Distance: >3 FB Neck ROM: Full    Dental no notable dental hx.    Pulmonary neg pulmonary ROS, former smoker,    Pulmonary exam normal        Cardiovascular hypertension, Pt. on medications Normal cardiovascular exam     Neuro/Psych negative neurological ROS  negative psych ROS   GI/Hepatic negative GI ROS, Neg liver ROS,   Endo/Other  negative endocrine ROS  Renal/GU negative Renal ROS  negative genitourinary   Musculoskeletal  (+) Arthritis , Osteoarthritis,    Abdominal   Peds negative pediatric ROS (+)  Hematology negative hematology ROS (+)   Anesthesia Other Findings   Reproductive/Obstetrics                            Anesthesia Physical Anesthesia Plan  ASA: II  Anesthesia Plan: MAC   Post-op Pain Management:    Induction: Intravenous  Airway Management Planned: Nasal Cannula  Additional Equipment: None  Intra-op Plan:   Post-operative Plan:   Informed Consent: I have reviewed the patients History and Physical, chart, labs and discussed the procedure including the risks, benefits and alternatives for the proposed anesthesia with the patient or authorized representative who has indicated his/her understanding and acceptance.   Dental advisory given  Plan Discussed with: CRNA and Surgeon  Anesthesia Plan Comments:         Anesthesia Quick Evaluation

## 2016-01-25 NOTE — Op Note (Addendum)
[facility] Patient Name: Malik Nicholson Procedure Date: 01/25/2016 MRN: [id number] Attending MD: Carol Ada , MD Date of Birth: 1940/06/30 CSN: [id number] Age: 75 Admit Type: Outpatient Procedure:                Upper EUS Indications:              Pancreatic cyst Providers:                Carol Ada, MD, Laverta Baltimore RN, RN, Elspeth Cho Tech., Technician, Marla Roe, CRNA Referring MD:              Medicines:                Propofol per Anesthesia Complications:            No immediate complications. Estimated Blood Loss:     Estimated blood loss: none. Procedure:                Pre-Anesthesia Assessment:                           - Prior to the procedure, a History and Physical                            was performed, and patient medications and                            allergies were reviewed. The patient's tolerance of                            previous anesthesia was also reviewed. The risks                            and benefits of the procedure and the sedation                            options and risks were discussed with the patient.                            All questions were answered, and informed consent                            was obtained. Prior Anticoagulants: The patient has                            taken no previous anticoagulant or antiplatelet                            agents. ASA Grade Assessment: II - A patient with                            mild systemic disease. After reviewing the risks  and benefits, the patient was deemed in                            satisfactory condition to undergo the procedure.                           - Sedation was administered by an anesthesia                            professional. Deep sedation was attained.                           After obtaining informed consent, the endoscope was                            passed under direct  vision. Throughout the                            procedure, the patient's blood pressure, pulse, and                            oxygen saturations were monitored continuously. The                            [id number]) scope was introduced through                            the mouth, and advanced to the second part of                            duodenum. The upper EUS was accomplished without                            difficulty. The patient tolerated the procedure                            well. Scope In: Scope Out: Findings:      Endosonographic Finding :      An anechoic lesion suggestive of a cyst was identified in the pancreatic       body. It is not in obvious communication with the pancreatic duct. The       lesion measured 32 mm by 22 mm in maximal cross-sectional diameter.       There was a single compartment without septae. The outer wall of the       lesion was thick. There was no associated mass. There was no internal       debris within the fluid-filled cavity. Diagnostic needle aspiration for       fluid was performed. Color Doppler imaging was utilized prior to needle       puncture to confirm a lack of significant vascular structures within the       needle path. One pass was made with the 22 gauge needle using a       transgastric approach. No stylet was used. The amount of fluid collected       was 20 mL. The fluid  was yellow and thin. Sample(s) were sent for       cytology, CEA, and amylase.      A narrow window to aspirate the cyst was obtained as the PV was superior       to the cyst and the aorta was inferior. The cyst was completely drained.       There was no obvious communication with any pancreatic duct. The       pancreatic parenchyma suprisingly appeared to be normal in spite of his       ETOH history. Impression:               - A cystic lesion was seen in the pancreatic body.                            Fine needle aspiration for fluid  performed. Moderate Sedation:      N/A- Per Anesthesia Care Recommendation:           - Patient has a contact number available for                            emergencies. The signs and symptoms of potential                            delayed complications were discussed with the                            patient. Return to normal activities tomorrow.                            Written discharge instructions were provided to the                            patient.                           - Resume previous diet.                           - Cipro (ciprofloxacin) 500 mg PO BID for 3 weeks. 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 All rights reserved. The codes documented in this report are preliminary and upon coder review may  be revised to meet current compliance requirements. Carol Ada, MD Carol Ada, MD 01/25/2016 1:10:57 PM This report has been signed electronically. Number of Addenda: 1 Addendum Number: 1   Addendum Date: 01/25/2016 1:20:42 PM      The MRI scan suggested a mass component to the cyst, but I was not able       to identify any further abnormalities outside of the eccentric cyst. Carol Ada, MD Carol Ada, MD 01/25/2016 1:21:40 PM This report has been signed electronically.

## 2016-01-26 LAB — MISC LABCORP TEST (SEND OUT): Labcorp test code: 88062

## 2016-01-27 LAB — MISC LABCORP TEST (SEND OUT): Labcorp test code: 142331

## 2016-01-28 ENCOUNTER — Encounter (HOSPITAL_COMMUNITY): Payer: Self-pay | Admitting: Gastroenterology

## 2016-01-28 DIAGNOSIS — H269 Unspecified cataract: Secondary | ICD-10-CM | POA: Diagnosis not present

## 2016-01-28 DIAGNOSIS — I10 Essential (primary) hypertension: Secondary | ICD-10-CM | POA: Diagnosis not present

## 2016-01-28 DIAGNOSIS — Z6826 Body mass index (BMI) 26.0-26.9, adult: Secondary | ICD-10-CM | POA: Diagnosis not present

## 2016-01-28 DIAGNOSIS — E785 Hyperlipidemia, unspecified: Secondary | ICD-10-CM | POA: Diagnosis not present

## 2016-01-28 DIAGNOSIS — H547 Unspecified visual loss: Secondary | ICD-10-CM | POA: Diagnosis not present

## 2016-01-28 DIAGNOSIS — M170 Bilateral primary osteoarthritis of knee: Secondary | ICD-10-CM | POA: Diagnosis not present

## 2016-02-12 DIAGNOSIS — I10 Essential (primary) hypertension: Secondary | ICD-10-CM | POA: Diagnosis not present

## 2016-02-12 DIAGNOSIS — K862 Cyst of pancreas: Secondary | ICD-10-CM | POA: Diagnosis not present

## 2016-02-12 DIAGNOSIS — E785 Hyperlipidemia, unspecified: Secondary | ICD-10-CM | POA: Diagnosis not present

## 2016-05-09 DIAGNOSIS — I10 Essential (primary) hypertension: Secondary | ICD-10-CM | POA: Diagnosis not present

## 2016-05-13 DIAGNOSIS — K862 Cyst of pancreas: Secondary | ICD-10-CM | POA: Diagnosis not present

## 2016-05-13 DIAGNOSIS — E785 Hyperlipidemia, unspecified: Secondary | ICD-10-CM | POA: Diagnosis not present

## 2016-05-13 DIAGNOSIS — I10 Essential (primary) hypertension: Secondary | ICD-10-CM | POA: Diagnosis not present

## 2016-05-13 DIAGNOSIS — R739 Hyperglycemia, unspecified: Secondary | ICD-10-CM | POA: Diagnosis not present

## 2016-09-25 DIAGNOSIS — R739 Hyperglycemia, unspecified: Secondary | ICD-10-CM | POA: Diagnosis not present

## 2016-09-25 DIAGNOSIS — Z0000 Encounter for general adult medical examination without abnormal findings: Secondary | ICD-10-CM | POA: Diagnosis not present

## 2016-09-25 DIAGNOSIS — I10 Essential (primary) hypertension: Secondary | ICD-10-CM | POA: Diagnosis not present

## 2016-09-30 DIAGNOSIS — I10 Essential (primary) hypertension: Secondary | ICD-10-CM | POA: Diagnosis not present

## 2016-09-30 DIAGNOSIS — Z0000 Encounter for general adult medical examination without abnormal findings: Secondary | ICD-10-CM | POA: Diagnosis not present

## 2016-09-30 DIAGNOSIS — E785 Hyperlipidemia, unspecified: Secondary | ICD-10-CM | POA: Diagnosis not present

## 2016-12-22 ENCOUNTER — Ambulatory Visit (INDEPENDENT_AMBULATORY_CARE_PROVIDER_SITE_OTHER): Payer: Medicare HMO

## 2016-12-22 ENCOUNTER — Encounter: Payer: Self-pay | Admitting: Orthopedic Surgery

## 2016-12-22 ENCOUNTER — Ambulatory Visit (INDEPENDENT_AMBULATORY_CARE_PROVIDER_SITE_OTHER): Payer: Medicare HMO | Admitting: Orthopedic Surgery

## 2016-12-22 ENCOUNTER — Ambulatory Visit: Payer: Medicare HMO

## 2016-12-22 DIAGNOSIS — Z96642 Presence of left artificial hip joint: Secondary | ICD-10-CM

## 2016-12-22 NOTE — Progress Notes (Signed)
Patient ID: Malik Nicholson, male   DOB: Sep 09, 1940, 76 y.o.   MRN: [id number]  Chief Complaint  Patient presents with  . Follow-up    xray LT THA, DOS 02/02/12    HPI Malik Nicholson is a 76 y.o. male.   Status post  total hip arthroplasty. The patient is doing well the hip implant is functioning well. He reports no new pain in his left hip he does complain of some soreness occasionally and stiffness after sitting for long periods of time. The hip is now 77 years old. No instability reported  Review of Systems Review of Systems  Constitutional symptoms no fever or chills Neurologic symptoms no numbness or tingling  Past Medical History:  Diagnosis Date  . Hypertension     Past Surgical History:  Procedure Laterality Date  . CATARACT EXTRACTION W/PHACO  07/29/2012   Procedure: CATARACT EXTRACTION PHACO AND INTRAOCULAR LENS PLACEMENT (IOC);  Surgeon: Tonny Branch, MD;  Location: [facility];  Service: Ophthalmology;  Laterality: Right;  CDE:24.36  . EUS N/A 01/25/2016   Procedure: UPPER ENDOSCOPIC ULTRASOUND (EUS) LINEAR;  Surgeon: Carol Ada, MD;  Location: [facility];  Service: Endoscopy;  Laterality: N/A;  . HERNIA REPAIR    . SHOULDER SURGERY Left   . TOTAL HIP ARTHROPLASTY  02/02/2012   Procedure: TOTAL HIP ARTHROPLASTY;  Surgeon: Carole Civil, MD;  Location: [facility];  Service: Orthopedics;  Laterality: Left;     Allergies  Allergen Reactions  . Lisinopril Swelling    Current Outpatient Prescriptions  Medication Sig Dispense Refill  . atorvastatin (LIPITOR) 10 MG tablet Take 10 mg by mouth daily.    . fluticasone (FLONASE) 50 MCG/ACT nasal spray Place 1 spray into both nostrils daily.    Marland Kitchen losartan (COZAAR) 50 MG tablet Take 50 mg by mouth daily.     No current facility-administered medications for this visit.      Physical Exam There were no vitals taken for this visit.  Overall appearance normal grooming and hygiene normal. The patient is awake alert and  oriented 3 with a pleasant mood and affect. The patient is ambulatory without assistive device and without limping.  LEFT HIP: The skin incision is healed without any erythema or tenderness or neuroma. Hip flexion strength grade 5. Hip is stable to post pull and abduction stress test. Hip flexion is 120. There is no tenderness or swelling around the hip. Distal pulses are intact sensation is normal and there is no lymphadenopathy in the groin patient walks with normal balance and coordination  Right hip inspection no tenderness, flexion is 120, stable post pull test. Normal hip flexor strength grade 5. Skin normal. Pulses normal. Sensation normal.  Data Reviewed Today's imaging shows stable LEFT total hip implant without loosening or complication  Assessment Status post LEFT total hip, stable   Plan Routine repeat x-ray right hip in one year  2:29 PM Arther Abbott, MD 12/22/2016

## 2016-12-22 NOTE — Patient Instructions (Signed)
NEEDS HANDICAP FROM

## 2017-03-26 DIAGNOSIS — Z125 Encounter for screening for malignant neoplasm of prostate: Secondary | ICD-10-CM | POA: Diagnosis not present

## 2017-03-26 DIAGNOSIS — E118 Type 2 diabetes mellitus with unspecified complications: Secondary | ICD-10-CM | POA: Diagnosis not present

## 2017-03-26 DIAGNOSIS — I10 Essential (primary) hypertension: Secondary | ICD-10-CM | POA: Diagnosis not present

## 2017-03-31 DIAGNOSIS — E7800 Pure hypercholesterolemia, unspecified: Secondary | ICD-10-CM | POA: Diagnosis not present

## 2017-03-31 DIAGNOSIS — I10 Essential (primary) hypertension: Secondary | ICD-10-CM | POA: Diagnosis not present

## 2017-03-31 DIAGNOSIS — R350 Frequency of micturition: Secondary | ICD-10-CM | POA: Diagnosis not present

## 2017-09-30 DIAGNOSIS — R7301 Impaired fasting glucose: Secondary | ICD-10-CM | POA: Diagnosis not present

## 2017-09-30 DIAGNOSIS — I10 Essential (primary) hypertension: Secondary | ICD-10-CM | POA: Diagnosis not present

## 2017-09-30 DIAGNOSIS — Z125 Encounter for screening for malignant neoplasm of prostate: Secondary | ICD-10-CM | POA: Diagnosis not present

## 2017-10-06 DIAGNOSIS — Z0000 Encounter for general adult medical examination without abnormal findings: Secondary | ICD-10-CM | POA: Diagnosis not present

## 2017-12-30 DIAGNOSIS — I10 Essential (primary) hypertension: Secondary | ICD-10-CM | POA: Diagnosis not present

## 2017-12-30 DIAGNOSIS — R634 Abnormal weight loss: Secondary | ICD-10-CM | POA: Diagnosis not present

## 2017-12-30 DIAGNOSIS — E782 Mixed hyperlipidemia: Secondary | ICD-10-CM | POA: Diagnosis not present

## 2017-12-30 DIAGNOSIS — Z6824 Body mass index (BMI) 24.0-24.9, adult: Secondary | ICD-10-CM | POA: Diagnosis not present

## 2018-01-11 IMAGING — MR MR ABDOMEN WO/W CM
9 of 18 series · 21 of 48 positions shown · IV contrast (multihance)
Comparison: 11/03/2013 CT abdomen/pelvis.

CLINICAL DATA: Abdominal swelling. Low-attenuation pancreatic head
mass on prior CT study.

EXAM:
MRI ABDOMEN WITHOUT AND WITH CONTRAST
TECHNIQUE: Multiplanar multisequence MR imaging of the abdomen was performed
both before and after the administration of intravenous contrast.
CONTRAST:  18mL MULTIHANCE GADOBENATE DIMEGLUMINE 529 MG/ML IV SOLN

[Series 3: T2 fat-sat · axial · 5.0mm · 0.78mm/px · 1 of 54 slices shown]
[im 1/54]
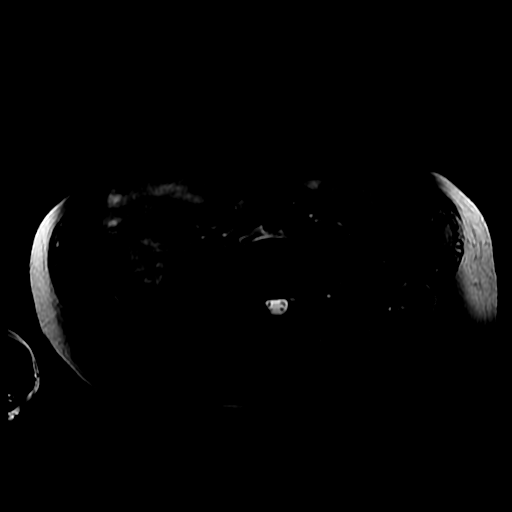

[Series 4: DWI b500 · axial · 6.0mm · 1.72mm/px · z∈[-116,+165]mm · 3 of 73 slices shown]
[im 1/73]
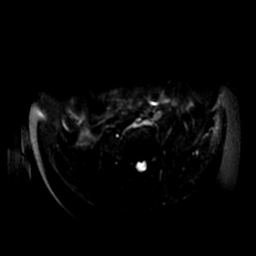
[im 37/73]
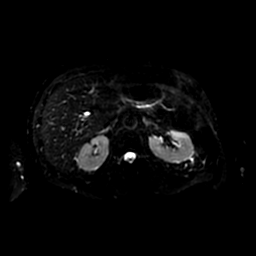
[im 73/73]
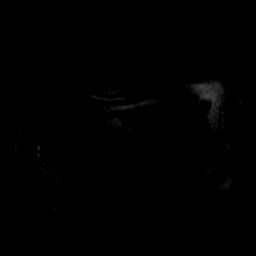

[Series 5: ax dualecho · axial · 5.0mm · 0.78mm/px · z∈[-103,+162]mm · 4 of 108 slices shown]
[im 1/108]
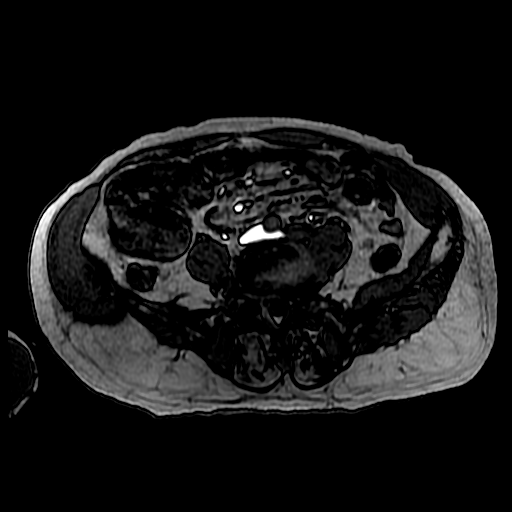
[im 36/108]
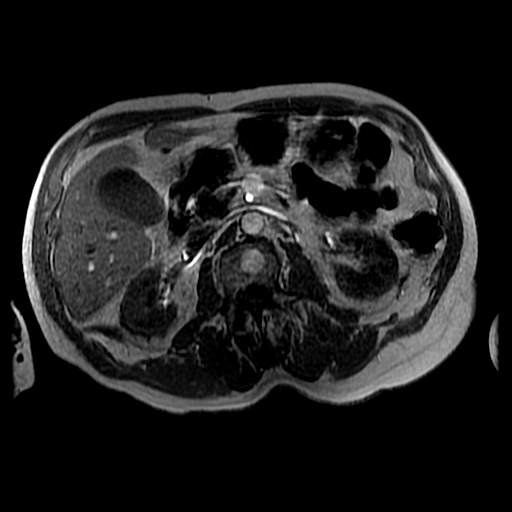
[im 72/108]
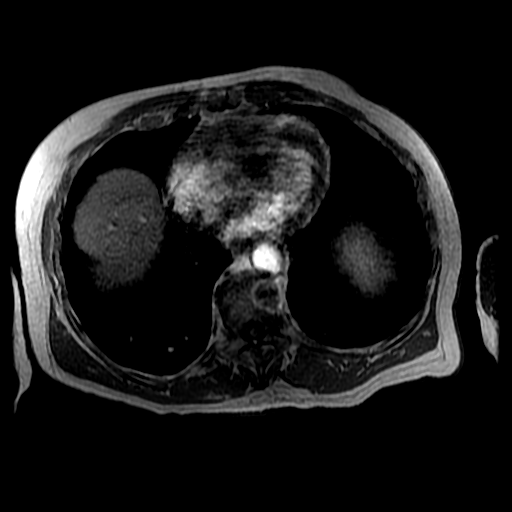
[im 108/108]
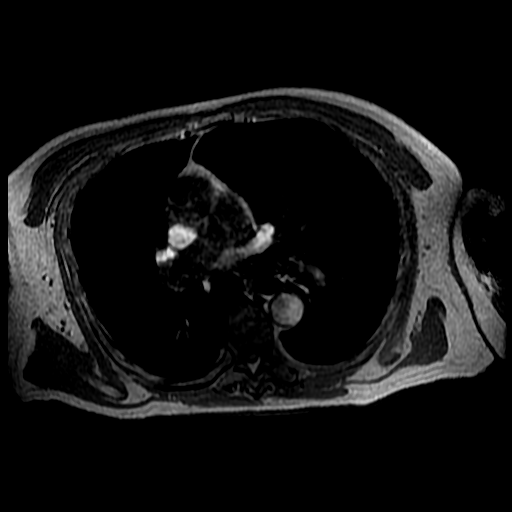

[Series 6: T2 · axial · 5.0mm · 0.78mm/px · z∈[-93,+162]mm · 2 of 52 slices shown (1 of 2)]
[im 1/52]
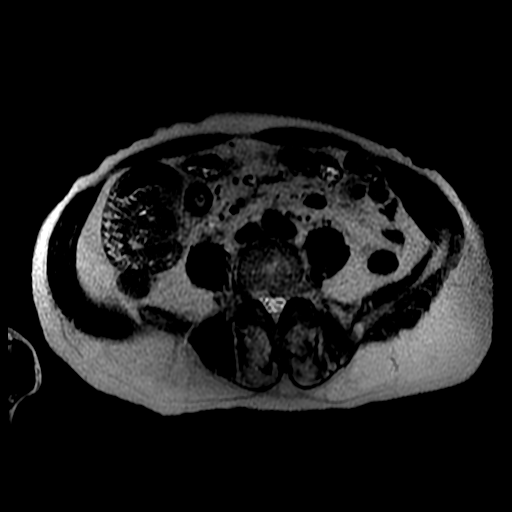
[im 52/52]
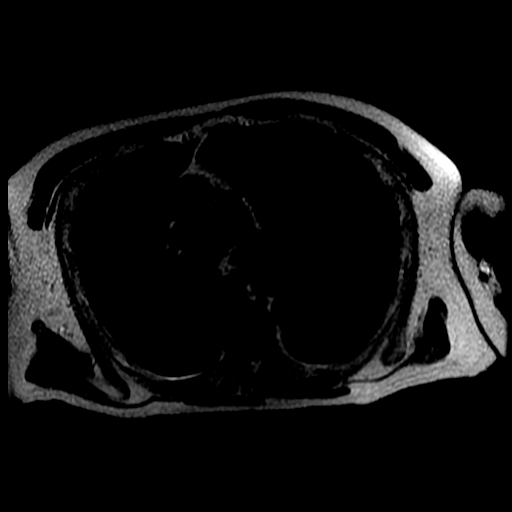

[Series 7: T2 · coronal · 5.0mm · 0.78mm/px · 2 of 52 slices shown (2 of 2)]
[im 1/52]
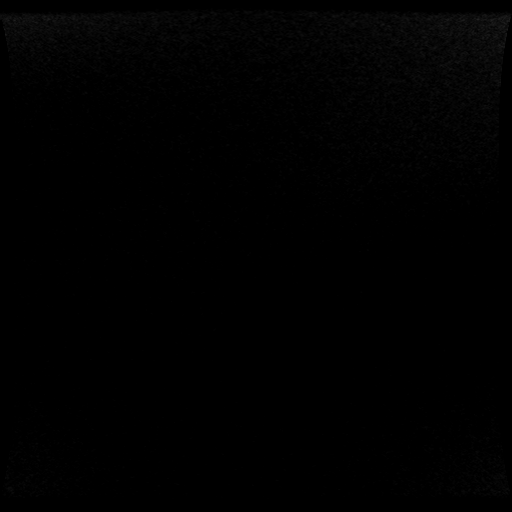
[im 52/52]
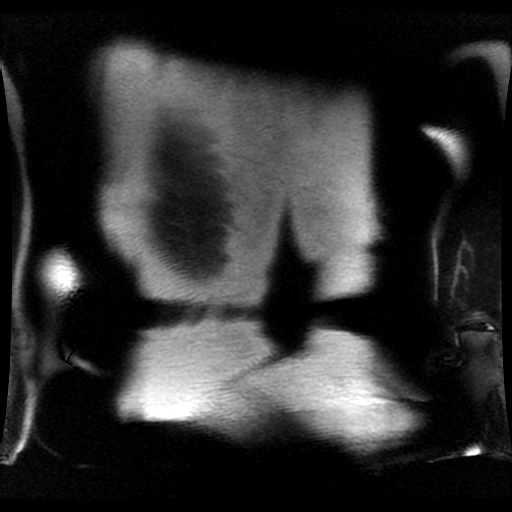

[Series 8: bSSFP · axial · 5.0mm · 0.78mm/px · z∈[-103,+162]mm · 2 of 54 slices shown]
[im 1/54]
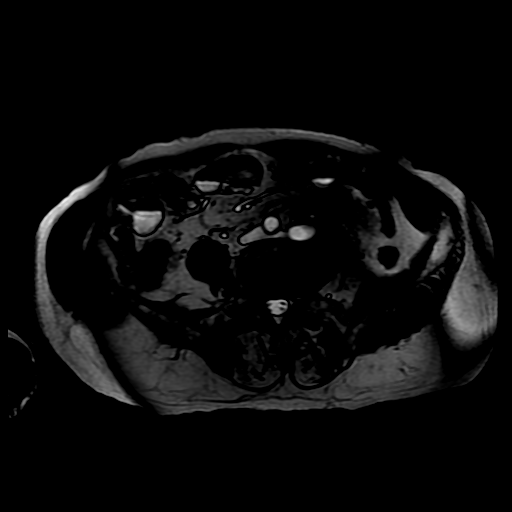
[im 54/54]
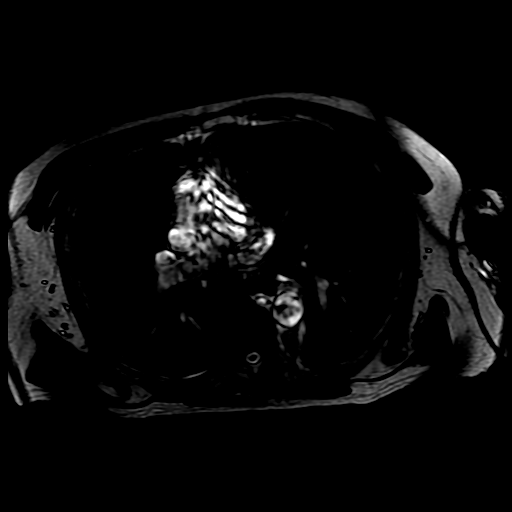

[Series 400: DWI · axial · 6.0mm · 1.72mm/px · 1 of 37 slices shown]
[im 1/37]
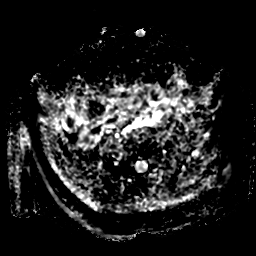

[Series 900: T1 dynamic · axial · 5.8mm · 0.78mm/px · z∈[-91,+161]mm · 3 of 88 slices shown (1 of 2)]
[im 1/88]
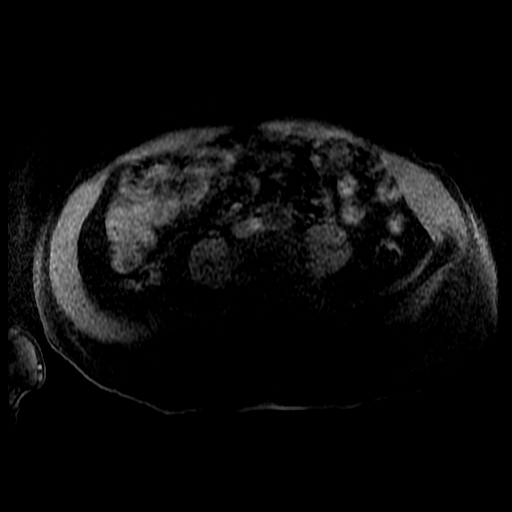
[im 44/88]
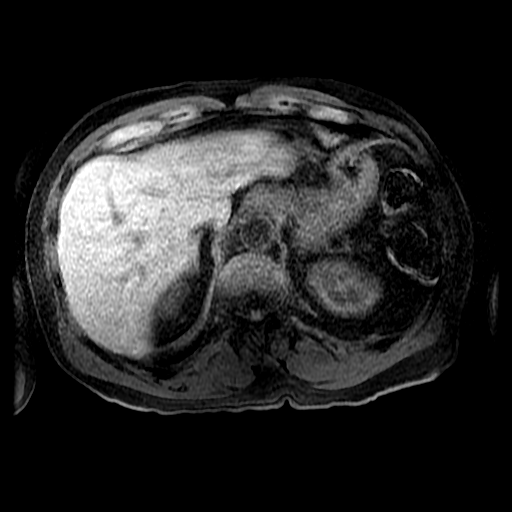
[im 88/88]
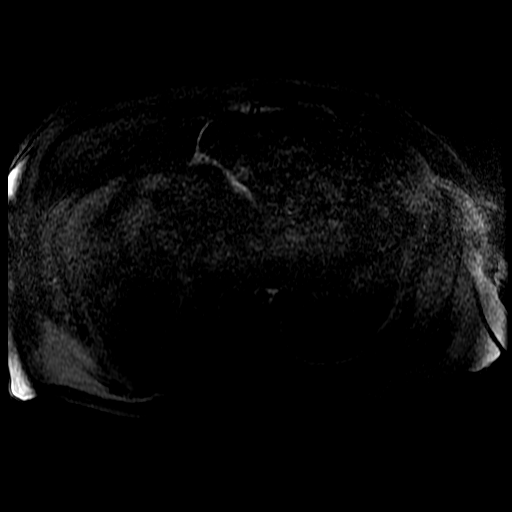

[Series 901: T1 dynamic · axial · 5.8mm · 0.78mm/px · z∈[-91,+161]mm · 3 of 88 slices shown (2 of 2)]
[im 1/88]
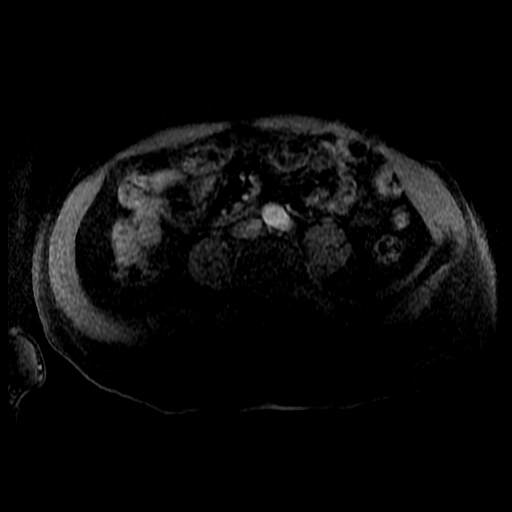
[im 44/88]
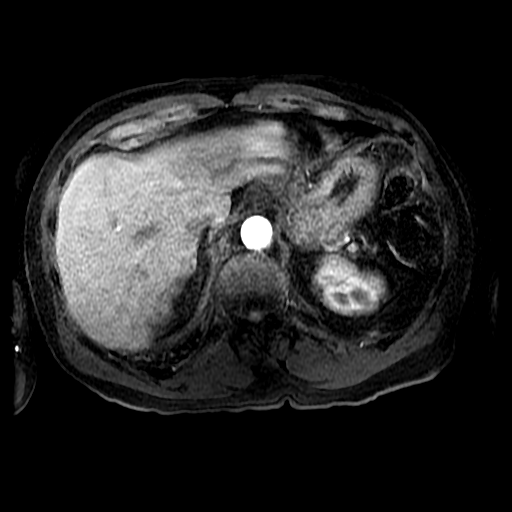
[im 88/88]
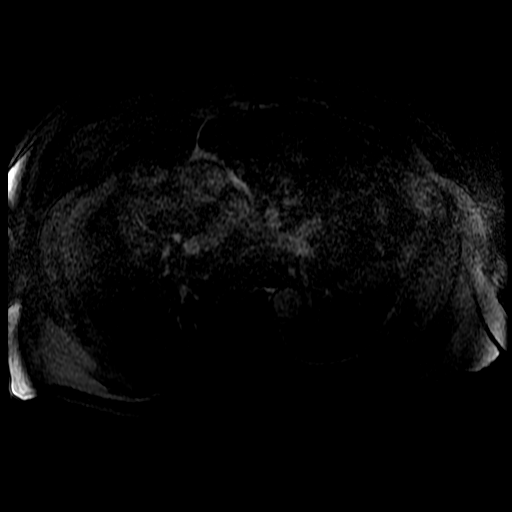

[21 of 48 positions shown; findings below may reference images not displayed]

FINDINGS: Motion degraded study.

Lower chest: Clear lung bases.

Hepatobiliary: Normal liver size and configuration. No hepatic
steatosis. No liver mass. Normal gallbladder with no cholelithiasis.
No biliary ductal dilatation. Common bile duct diameter 5 mm. No
choledocholithiasis.

Pancreas: There is a unilocular lobulated 3.9 x 3.0 x 2.6 cm cystic
pancreatic mass in the anterior pancreatic head (series 3/ image
37), which demonstrates no internal solid enhancing components, and
which is increased in size from 2.9 x 2.4 x 2.1 cm on 11/03/2013. No
main pancreatic duct dilation. No additional pancreatic lesions.
Background pancreatic parenchyma is normal in signal intensity. No
evidence of pancreas divisum.

Spleen: Normal size. No mass.

Adrenals/Urinary Tract: Normal adrenals. No hydronephrosis.
Subcentimeter simple renal cysts in the right kidney. No suspicious
renal masses.

Stomach/Bowel: Grossly normal stomach. Visualized small and large
bowel is normal caliber, with no bowel wall thickening.

Vascular/Lymphatic: Normal caliber abdominal aorta. Patent portal,
splenic, hepatic and renal veins. No pathologically enlarged lymph
nodes in the abdomen.

Other: No abdominal ascites or focal fluid collection.

Musculoskeletal: No aggressive appearing focal osseous lesions.
IMPRESSION: 1. Interval growth of unilocular 3.9 x 3.0 x 2.6 cm pancreatic head
cystic mass, without enhancement or other internal complexity. No
pancreatic duct dilation. Differential includes side branch IPMN
versus mucinous cystic pancreatic neoplasm. Given the size and
growth, recommend correlation with endoscopic ultrasound with cyst
aspiration and surgical consultation, as clinically warranted.
2. No lymphadenopathy or other findings of metastatic disease in the
abdomen.

## 2018-03-01 DIAGNOSIS — R634 Abnormal weight loss: Secondary | ICD-10-CM | POA: Diagnosis not present

## 2018-03-01 DIAGNOSIS — Z125 Encounter for screening for malignant neoplasm of prostate: Secondary | ICD-10-CM | POA: Diagnosis not present

## 2018-03-01 DIAGNOSIS — E039 Hypothyroidism, unspecified: Secondary | ICD-10-CM | POA: Diagnosis not present

## 2018-03-01 DIAGNOSIS — Z6824 Body mass index (BMI) 24.0-24.9, adult: Secondary | ICD-10-CM | POA: Diagnosis not present

## 2018-03-01 DIAGNOSIS — E782 Mixed hyperlipidemia: Secondary | ICD-10-CM | POA: Diagnosis not present

## 2018-03-01 DIAGNOSIS — I10 Essential (primary) hypertension: Secondary | ICD-10-CM | POA: Diagnosis not present

## 2018-03-01 DIAGNOSIS — R7301 Impaired fasting glucose: Secondary | ICD-10-CM | POA: Diagnosis not present

## 2018-03-08 ENCOUNTER — Other Ambulatory Visit (HOSPITAL_COMMUNITY): Payer: Self-pay | Admitting: Pulmonary Disease

## 2018-03-08 DIAGNOSIS — R634 Abnormal weight loss: Secondary | ICD-10-CM

## 2018-03-08 DIAGNOSIS — Z6824 Body mass index (BMI) 24.0-24.9, adult: Secondary | ICD-10-CM | POA: Diagnosis not present

## 2018-03-08 DIAGNOSIS — R0781 Pleurodynia: Secondary | ICD-10-CM | POA: Diagnosis not present

## 2018-03-08 DIAGNOSIS — E782 Mixed hyperlipidemia: Secondary | ICD-10-CM | POA: Diagnosis not present

## 2018-03-08 DIAGNOSIS — D582 Other hemoglobinopathies: Secondary | ICD-10-CM | POA: Diagnosis not present

## 2018-03-08 DIAGNOSIS — K5900 Constipation, unspecified: Secondary | ICD-10-CM | POA: Diagnosis not present

## 2018-03-08 DIAGNOSIS — I10 Essential (primary) hypertension: Secondary | ICD-10-CM | POA: Diagnosis not present

## 2018-03-18 ENCOUNTER — Other Ambulatory Visit: Payer: Self-pay | Admitting: Adult Health Nurse Practitioner

## 2018-03-18 ENCOUNTER — Other Ambulatory Visit (HOSPITAL_COMMUNITY): Payer: Self-pay | Admitting: Internal Medicine

## 2018-03-18 DIAGNOSIS — R634 Abnormal weight loss: Secondary | ICD-10-CM

## 2018-03-22 ENCOUNTER — Ambulatory Visit (HOSPITAL_COMMUNITY)
Admission: RE | Admit: 2018-03-22 | Discharge: 2018-03-22 | Disposition: A | Discharge status: Home / Self Care | Payer: Medicare HMO | Source: Ambulatory Visit | Attending: Pulmonary Disease | Admitting: Pulmonary Disease

## 2018-03-22 ENCOUNTER — Encounter (HOSPITAL_COMMUNITY): Payer: Self-pay

## 2018-03-24 ENCOUNTER — Ambulatory Visit (HOSPITAL_COMMUNITY)
Admission: RE | Admit: 2018-03-24 | Discharge: 2018-03-24 | Disposition: A | Discharge status: Home / Self Care | Payer: Medicare HMO | Source: Ambulatory Visit | Attending: Adult Health Nurse Practitioner | Admitting: Adult Health Nurse Practitioner

## 2018-03-24 DIAGNOSIS — R634 Abnormal weight loss: Secondary | ICD-10-CM | POA: Insufficient documentation

## 2018-03-24 DIAGNOSIS — K862 Cyst of pancreas: Secondary | ICD-10-CM | POA: Diagnosis not present

## 2018-03-24 DIAGNOSIS — K4090 Unilateral inguinal hernia, without obstruction or gangrene, not specified as recurrent: Secondary | ICD-10-CM | POA: Diagnosis not present

## 2018-03-24 DIAGNOSIS — K449 Diaphragmatic hernia without obstruction or gangrene: Secondary | ICD-10-CM | POA: Diagnosis not present

## 2018-04-05 DIAGNOSIS — K5900 Constipation, unspecified: Secondary | ICD-10-CM | POA: Diagnosis not present

## 2018-04-05 DIAGNOSIS — I708 Atherosclerosis of other arteries: Secondary | ICD-10-CM | POA: Diagnosis not present

## 2018-04-05 DIAGNOSIS — I10 Essential (primary) hypertension: Secondary | ICD-10-CM | POA: Diagnosis not present

## 2018-04-05 DIAGNOSIS — Z6824 Body mass index (BMI) 24.0-24.9, adult: Secondary | ICD-10-CM | POA: Diagnosis not present

## 2018-04-05 DIAGNOSIS — K862 Cyst of pancreas: Secondary | ICD-10-CM | POA: Diagnosis not present

## 2018-04-05 DIAGNOSIS — R634 Abnormal weight loss: Secondary | ICD-10-CM | POA: Diagnosis not present

## 2018-04-05 DIAGNOSIS — R0781 Pleurodynia: Secondary | ICD-10-CM | POA: Diagnosis not present

## 2018-08-23 DIAGNOSIS — E782 Mixed hyperlipidemia: Secondary | ICD-10-CM | POA: Diagnosis not present

## 2018-08-23 DIAGNOSIS — I10 Essential (primary) hypertension: Secondary | ICD-10-CM | POA: Diagnosis not present

## 2018-08-30 DIAGNOSIS — R0781 Pleurodynia: Secondary | ICD-10-CM | POA: Diagnosis not present

## 2018-08-30 DIAGNOSIS — I708 Atherosclerosis of other arteries: Secondary | ICD-10-CM | POA: Diagnosis not present

## 2018-08-30 DIAGNOSIS — G3184 Mild cognitive impairment, so stated: Secondary | ICD-10-CM | POA: Diagnosis not present

## 2018-08-30 DIAGNOSIS — R7301 Impaired fasting glucose: Secondary | ICD-10-CM | POA: Diagnosis not present

## 2018-08-30 DIAGNOSIS — K5900 Constipation, unspecified: Secondary | ICD-10-CM | POA: Diagnosis not present

## 2018-08-30 DIAGNOSIS — I10 Essential (primary) hypertension: Secondary | ICD-10-CM | POA: Diagnosis not present

## 2018-08-30 DIAGNOSIS — E782 Mixed hyperlipidemia: Secondary | ICD-10-CM | POA: Diagnosis not present

## 2018-08-30 DIAGNOSIS — K862 Cyst of pancreas: Secondary | ICD-10-CM | POA: Diagnosis not present

## 2018-08-30 DIAGNOSIS — Z6824 Body mass index (BMI) 24.0-24.9, adult: Secondary | ICD-10-CM | POA: Diagnosis not present

## 2018-08-30 DIAGNOSIS — R52 Pain, unspecified: Secondary | ICD-10-CM | POA: Diagnosis not present

## 2018-08-30 DIAGNOSIS — N182 Chronic kidney disease, stage 2 (mild): Secondary | ICD-10-CM | POA: Diagnosis not present

## 2018-08-30 DIAGNOSIS — F1010 Alcohol abuse, uncomplicated: Secondary | ICD-10-CM | POA: Diagnosis not present

## 2018-08-30 DIAGNOSIS — R634 Abnormal weight loss: Secondary | ICD-10-CM | POA: Diagnosis not present

## 2018-08-30 DIAGNOSIS — I129 Hypertensive chronic kidney disease with stage 1 through stage 4 chronic kidney disease, or unspecified chronic kidney disease: Secondary | ICD-10-CM | POA: Diagnosis not present

## 2018-08-30 DIAGNOSIS — D582 Other hemoglobinopathies: Secondary | ICD-10-CM | POA: Diagnosis not present

## 2018-10-14 DIAGNOSIS — Z0000 Encounter for general adult medical examination without abnormal findings: Secondary | ICD-10-CM | POA: Diagnosis not present

## 2019-03-07 DIAGNOSIS — E782 Mixed hyperlipidemia: Secondary | ICD-10-CM | POA: Diagnosis not present

## 2019-03-07 DIAGNOSIS — I10 Essential (primary) hypertension: Secondary | ICD-10-CM | POA: Diagnosis not present

## 2019-03-07 DIAGNOSIS — N182 Chronic kidney disease, stage 2 (mild): Secondary | ICD-10-CM | POA: Diagnosis not present

## 2019-03-07 DIAGNOSIS — R7301 Impaired fasting glucose: Secondary | ICD-10-CM | POA: Diagnosis not present

## 2019-03-14 DIAGNOSIS — K5900 Constipation, unspecified: Secondary | ICD-10-CM | POA: Diagnosis not present

## 2019-03-14 DIAGNOSIS — G3184 Mild cognitive impairment, so stated: Secondary | ICD-10-CM | POA: Diagnosis not present

## 2019-03-14 DIAGNOSIS — I10 Essential (primary) hypertension: Secondary | ICD-10-CM | POA: Diagnosis not present

## 2019-03-14 DIAGNOSIS — R7301 Impaired fasting glucose: Secondary | ICD-10-CM | POA: Diagnosis not present

## 2019-03-14 DIAGNOSIS — F1010 Alcohol abuse, uncomplicated: Secondary | ICD-10-CM | POA: Diagnosis not present

## 2019-03-14 DIAGNOSIS — E782 Mixed hyperlipidemia: Secondary | ICD-10-CM | POA: Diagnosis not present

## 2019-09-15 DIAGNOSIS — R972 Elevated prostate specific antigen [PSA]: Secondary | ICD-10-CM | POA: Diagnosis not present

## 2019-09-15 DIAGNOSIS — Z6824 Body mass index (BMI) 24.0-24.9, adult: Secondary | ICD-10-CM | POA: Diagnosis not present

## 2019-09-15 DIAGNOSIS — R7301 Impaired fasting glucose: Secondary | ICD-10-CM | POA: Diagnosis not present

## 2019-09-15 DIAGNOSIS — K5900 Constipation, unspecified: Secondary | ICD-10-CM | POA: Diagnosis not present

## 2019-09-15 DIAGNOSIS — I129 Hypertensive chronic kidney disease with stage 1 through stage 4 chronic kidney disease, or unspecified chronic kidney disease: Secondary | ICD-10-CM | POA: Diagnosis not present

## 2019-09-15 DIAGNOSIS — E782 Mixed hyperlipidemia: Secondary | ICD-10-CM | POA: Diagnosis not present

## 2019-09-15 DIAGNOSIS — I708 Atherosclerosis of other arteries: Secondary | ICD-10-CM | POA: Diagnosis not present

## 2019-09-15 DIAGNOSIS — F1010 Alcohol abuse, uncomplicated: Secondary | ICD-10-CM | POA: Diagnosis not present

## 2019-09-15 DIAGNOSIS — G3184 Mild cognitive impairment, so stated: Secondary | ICD-10-CM | POA: Diagnosis not present

## 2019-09-15 DIAGNOSIS — Z0000 Encounter for general adult medical examination without abnormal findings: Secondary | ICD-10-CM | POA: Diagnosis not present

## 2019-09-15 DIAGNOSIS — D582 Other hemoglobinopathies: Secondary | ICD-10-CM | POA: Diagnosis not present

## 2019-09-15 DIAGNOSIS — I10 Essential (primary) hypertension: Secondary | ICD-10-CM | POA: Diagnosis not present

## 2019-09-20 DIAGNOSIS — I10 Essential (primary) hypertension: Secondary | ICD-10-CM | POA: Diagnosis not present

## 2019-09-20 DIAGNOSIS — R7301 Impaired fasting glucose: Secondary | ICD-10-CM | POA: Diagnosis not present

## 2019-09-20 DIAGNOSIS — K5900 Constipation, unspecified: Secondary | ICD-10-CM | POA: Diagnosis not present

## 2019-09-20 DIAGNOSIS — G3184 Mild cognitive impairment, so stated: Secondary | ICD-10-CM | POA: Diagnosis not present

## 2019-09-20 DIAGNOSIS — E782 Mixed hyperlipidemia: Secondary | ICD-10-CM | POA: Diagnosis not present

## 2019-09-20 DIAGNOSIS — D649 Anemia, unspecified: Secondary | ICD-10-CM | POA: Diagnosis not present

## 2019-09-20 DIAGNOSIS — F1010 Alcohol abuse, uncomplicated: Secondary | ICD-10-CM | POA: Diagnosis not present

## 2019-11-23 DIAGNOSIS — I10 Essential (primary) hypertension: Secondary | ICD-10-CM | POA: Diagnosis not present

## 2019-11-23 DIAGNOSIS — G3184 Mild cognitive impairment, so stated: Secondary | ICD-10-CM | POA: Diagnosis not present

## 2019-11-23 DIAGNOSIS — F1010 Alcohol abuse, uncomplicated: Secondary | ICD-10-CM | POA: Diagnosis not present

## 2020-03-14 DIAGNOSIS — E782 Mixed hyperlipidemia: Secondary | ICD-10-CM | POA: Diagnosis not present

## 2020-03-14 DIAGNOSIS — I10 Essential (primary) hypertension: Secondary | ICD-10-CM | POA: Diagnosis not present

## 2020-03-14 DIAGNOSIS — Z0000 Encounter for general adult medical examination without abnormal findings: Secondary | ICD-10-CM | POA: Diagnosis not present

## 2020-03-14 DIAGNOSIS — Z6824 Body mass index (BMI) 24.0-24.9, adult: Secondary | ICD-10-CM | POA: Diagnosis not present

## 2020-03-14 DIAGNOSIS — Z712 Person consulting for explanation of examination or test findings: Secondary | ICD-10-CM | POA: Diagnosis not present

## 2020-03-14 DIAGNOSIS — K5900 Constipation, unspecified: Secondary | ICD-10-CM | POA: Diagnosis not present

## 2020-03-14 DIAGNOSIS — D582 Other hemoglobinopathies: Secondary | ICD-10-CM | POA: Diagnosis not present

## 2020-03-14 DIAGNOSIS — R634 Abnormal weight loss: Secondary | ICD-10-CM | POA: Diagnosis not present

## 2020-03-14 DIAGNOSIS — D649 Anemia, unspecified: Secondary | ICD-10-CM | POA: Diagnosis not present

## 2020-03-22 DIAGNOSIS — R7301 Impaired fasting glucose: Secondary | ICD-10-CM | POA: Diagnosis not present

## 2020-03-22 DIAGNOSIS — N182 Chronic kidney disease, stage 2 (mild): Secondary | ICD-10-CM | POA: Diagnosis not present

## 2020-03-22 DIAGNOSIS — E782 Mixed hyperlipidemia: Secondary | ICD-10-CM | POA: Diagnosis not present

## 2020-03-22 DIAGNOSIS — I708 Atherosclerosis of other arteries: Secondary | ICD-10-CM | POA: Diagnosis not present

## 2020-03-22 DIAGNOSIS — K5900 Constipation, unspecified: Secondary | ICD-10-CM | POA: Diagnosis not present

## 2020-03-22 DIAGNOSIS — D649 Anemia, unspecified: Secondary | ICD-10-CM | POA: Diagnosis not present

## 2020-03-22 DIAGNOSIS — G3184 Mild cognitive impairment, so stated: Secondary | ICD-10-CM | POA: Diagnosis not present

## 2020-03-22 DIAGNOSIS — I129 Hypertensive chronic kidney disease with stage 1 through stage 4 chronic kidney disease, or unspecified chronic kidney disease: Secondary | ICD-10-CM | POA: Diagnosis not present

## 2020-03-22 DIAGNOSIS — Z0001 Encounter for general adult medical examination with abnormal findings: Secondary | ICD-10-CM | POA: Diagnosis not present

## 2020-03-22 DIAGNOSIS — I10 Essential (primary) hypertension: Secondary | ICD-10-CM | POA: Diagnosis not present

## 2020-03-22 DIAGNOSIS — F1010 Alcohol abuse, uncomplicated: Secondary | ICD-10-CM | POA: Diagnosis not present

## 2020-03-22 DIAGNOSIS — R32 Unspecified urinary incontinence: Secondary | ICD-10-CM | POA: Diagnosis not present

## 2020-04-18 DIAGNOSIS — D649 Anemia, unspecified: Secondary | ICD-10-CM | POA: Diagnosis not present

## 2020-04-18 DIAGNOSIS — G3184 Mild cognitive impairment, so stated: Secondary | ICD-10-CM | POA: Diagnosis not present

## 2020-04-18 DIAGNOSIS — R32 Unspecified urinary incontinence: Secondary | ICD-10-CM | POA: Diagnosis not present

## 2020-04-18 DIAGNOSIS — K5900 Constipation, unspecified: Secondary | ICD-10-CM | POA: Diagnosis not present

## 2020-04-18 DIAGNOSIS — I10 Essential (primary) hypertension: Secondary | ICD-10-CM | POA: Diagnosis not present

## 2020-04-18 DIAGNOSIS — E782 Mixed hyperlipidemia: Secondary | ICD-10-CM | POA: Diagnosis not present

## 2020-04-18 DIAGNOSIS — R7301 Impaired fasting glucose: Secondary | ICD-10-CM | POA: Diagnosis not present

## 2020-04-18 DIAGNOSIS — F1010 Alcohol abuse, uncomplicated: Secondary | ICD-10-CM | POA: Diagnosis not present

## 2020-04-20 DIAGNOSIS — D649 Anemia, unspecified: Secondary | ICD-10-CM | POA: Diagnosis not present

## 2020-04-20 DIAGNOSIS — R32 Unspecified urinary incontinence: Secondary | ICD-10-CM | POA: Diagnosis not present

## 2020-04-20 DIAGNOSIS — G3184 Mild cognitive impairment, so stated: Secondary | ICD-10-CM | POA: Diagnosis not present

## 2020-04-20 DIAGNOSIS — F1010 Alcohol abuse, uncomplicated: Secondary | ICD-10-CM | POA: Diagnosis not present

## 2020-04-20 DIAGNOSIS — I10 Essential (primary) hypertension: Secondary | ICD-10-CM | POA: Diagnosis not present

## 2020-04-20 DIAGNOSIS — R7301 Impaired fasting glucose: Secondary | ICD-10-CM | POA: Diagnosis not present

## 2020-04-20 DIAGNOSIS — E782 Mixed hyperlipidemia: Secondary | ICD-10-CM | POA: Diagnosis not present

## 2020-04-20 DIAGNOSIS — K5900 Constipation, unspecified: Secondary | ICD-10-CM | POA: Diagnosis not present

## 2020-05-25 ENCOUNTER — Other Ambulatory Visit: Payer: Self-pay

## 2020-05-25 ENCOUNTER — Ambulatory Visit (INDEPENDENT_AMBULATORY_CARE_PROVIDER_SITE_OTHER): Payer: Medicare HMO | Admitting: Urology

## 2020-05-25 ENCOUNTER — Encounter: Payer: Self-pay | Admitting: Urology

## 2020-05-25 VITALS — BP 144/72 | HR 67 | Temp 97.9°F | Ht 72.0 in | Wt 185.0 lb

## 2020-05-25 DIAGNOSIS — R32 Unspecified urinary incontinence: Secondary | ICD-10-CM | POA: Diagnosis not present

## 2020-05-25 LAB — URINALYSIS, ROUTINE W REFLEX MICROSCOPIC
Bilirubin, UA: NEGATIVE
Glucose, UA: NEGATIVE
Leukocytes,UA: NEGATIVE
Nitrite, UA: NEGATIVE
Protein,UA: NEGATIVE
Specific Gravity, UA: 1.02 (ref 1.005–1.030)
Urobilinogen, Ur: 2 mg/dL — ABNORMAL HIGH (ref 0.2–1.0)
pH, UA: 6 (ref 5.0–7.5)

## 2020-05-25 LAB — MICROSCOPIC EXAMINATION
Bacteria, UA: NONE SEEN
Epithelial Cells (non renal): NONE SEEN /hpf (ref 0–10)
Renal Epithel, UA: NONE SEEN /hpf

## 2020-05-25 LAB — BLADDER SCAN AMB NON-IMAGING: Scan Result: 0

## 2020-05-25 MED ORDER — ALFUZOSIN HCL ER 10 MG PO TB24: 10.0000 mg | ORAL_TABLET | Freq: Every day | ORAL | 11 refills | Status: DC

## 2020-05-25 NOTE — Patient Instructions (Signed)

## 2020-05-25 NOTE — Progress Notes (Signed)
05/25/2020 11:40 AM   Malik Nicholson [id number]  Referring provider: Celene Squibb, MD [address] [name],  [place] [address]  Urinary incontinence  HPI: Malik Nicholson is a 79yo here for evaluation of urinary incontinence. For the past 3 months he has noted worsening urinary urgency, frequency, urge incontinence, nocturia 2x. He drinks 9-12 beers per day. Stream is ok. No dysuria or hematuria. NO hx of UTI. No urinary hesitancy or starting/stopping of his urinary stream.  His beer comsumption has increased in the past several months   PMH: Past Medical History:  Diagnosis Date  . Arthritis   . High cholesterol   . Hypertension     Surgical History: Past Surgical History:  Procedure Laterality Date  . CATARACT EXTRACTION W/PHACO  07/29/2012   Procedure: CATARACT EXTRACTION PHACO AND INTRAOCULAR LENS PLACEMENT (IOC);  Surgeon: Tonny Branch, MD;  Location: [facility];  Service: Ophthalmology;  Laterality: Right;  CDE:24.36  . EUS N/A 01/25/2016   Procedure: UPPER ENDOSCOPIC ULTRASOUND (EUS) LINEAR;  Surgeon: Carol Ada, MD;  Location: [facility];  Service: Endoscopy;  Laterality: N/A;  . HERNIA REPAIR    . SHOULDER SURGERY Left   . TOTAL HIP ARTHROPLASTY  02/02/2012   Procedure: TOTAL HIP ARTHROPLASTY;  Surgeon: Carole Civil, MD;  Location: [facility];  Service: Orthopedics;  Laterality: Left;    Home Medications:  Allergies as of 05/25/2020      Reactions   Lisinopril Swelling      Medication List       Accurate as of May 25, 2020 11:40 AM. If you have any questions, ask your nurse or doctor.        atorvastatin 10 MG tablet Commonly known as: LIPITOR Take 10 mg by mouth daily.   docusate sodium 50 MG capsule Commonly known as: COLACE Take 50 mg by mouth 2 (two) times daily.   fluticasone 50 MCG/ACT nasal spray Commonly known as: FLONASE Place 1 spray into both nostrils daily.   losartan 50 MG tablet Commonly known as: COZAAR Take 50 mg by  mouth daily.   memantine 5 MG tablet Commonly known as: NAMENDA       Allergies:  Allergies  Allergen Reactions  . Lisinopril Swelling    Family History: Family History  Problem Relation Age of Onset  . Cancer Other   . Lung disease Other   . Diabetes Other   . Emphysema Father   . COPD Father   . Hypertension Mother     Social History:  reports that he quit smoking about 22 years ago. His smoking use included cigarettes. He has a 50.00 pack-year smoking history. He has never used smokeless tobacco. He reports current alcohol use of about 7.0 standard drinks of alcohol per week. He reports that he does not use drugs.  ROS: All other review of systems were reviewed and are negative except what is noted above in HPI  Physical Exam: BP (!) 144/72   Pulse 67   Temp 97.9 F (36.6 C)   Ht 6' (1.829 m)   Wt 185 lb (83.9 kg)   BMI 25.09 kg/m   Constitutional:  Alert and oriented, No acute distress. HEENT: [place] AT, moist mucus membranes.  Trachea midline, no masses. Cardiovascular: No clubbing, cyanosis, or edema. Respiratory: Normal respiratory effort, no increased work of breathing. GI: Abdomen is soft, nontender, nondistended, no abdominal masses GU: No CVA tenderness.  Lymph: No cervical or inguinal lymphadenopathy. Skin: No rashes, bruises  or suspicious lesions. Neurologic: Grossly intact, no focal deficits, moving all 4 extremities. Psychiatric: Normal mood and affect.  Laboratory Data: Lab Results  Component Value Date   WBC 9.6 11/03/2013   HGB 14.2 11/03/2013   HCT 42.1 11/03/2013   MCV 92.1 11/03/2013   PLT 274 11/03/2013    Lab Results  Component Value Date   CREATININE 0.93 11/03/2013    No results found for: PSA  No results found for: TESTOSTERONE  No results found for: HGBA1C  Urinalysis No results found for: COLORURINE, APPEARANCEUR, LABSPEC, PHURINE, GLUCOSEU, HGBUR, BILIRUBINUR, KETONESUR, PROTEINUR, UROBILINOGEN, NITRITE,  LEUKOCYTESUR  No results found for: LABMICR, [place], RBCUA, LABEPIT, MUCUS, BACTERIA  Pertinent Imaging:  No results found for this or any previous visit.  No results found for this or any previous visit.  No results found for this or any previous visit.  No results found for this or any previous visit.  No results found for this or any previous visit.  No results found for this or any previous visit.  No results found for this or any previous visit.  No results found for this or any previous visit.   Assessment & Plan:    1. Urinary incontinence, unspecified type -I counselled the patient to decrease his beer consumption as this is worsening his incontinence. We will also trial uroxatral 10mg  - BLADDER SCAN AMB NON-IMAGING - Urinalysis, Routine w reflex microscopic   No follow-ups on file.  [name], MD  [facility] [place]

## 2020-05-25 NOTE — Progress Notes (Signed)
Bladder Scan Patient can void: 0 ml Performed By: Durenda Guthrie, lpn  Urological Symptom Review  Patient is experiencing the following symptoms: Frequent urination Hard to postpone urination Get up at night to urinate Leakage of urine Trouble starting stream Erection problems (male only)   Review of Systems  Gastrointestinal (upper)  : Negative for upper GI symptoms  Gastrointestinal (lower) : Negative for lower GI symptoms  Constitutional : Negative for symptoms  Skin: Negative for skin symptoms  Eyes: Negative for eye symptoms  Ear/Nose/Throat : Negative for Ear/Nose/Throat symptoms  Hematologic/Lymphatic: Negative for Hematologic/Lymphatic symptoms  Cardiovascular : Negative for cardiovascular symptoms  Respiratory : Negative for respiratory symptoms  Endocrine: Negative for endocrine symptoms  Musculoskeletal: Joint pain  Neurological: Negative for neurological symptoms  Psychologic: Negative for psychiatric symptoms

## 2020-07-04 ENCOUNTER — Ambulatory Visit: Payer: Medicare HMO | Admitting: Urology

## 2020-07-21 DIAGNOSIS — R32 Unspecified urinary incontinence: Secondary | ICD-10-CM | POA: Diagnosis not present

## 2020-07-21 DIAGNOSIS — G3184 Mild cognitive impairment, so stated: Secondary | ICD-10-CM | POA: Diagnosis not present

## 2020-07-21 DIAGNOSIS — K5900 Constipation, unspecified: Secondary | ICD-10-CM | POA: Diagnosis not present

## 2020-07-21 DIAGNOSIS — D649 Anemia, unspecified: Secondary | ICD-10-CM | POA: Diagnosis not present

## 2020-07-21 DIAGNOSIS — I10 Essential (primary) hypertension: Secondary | ICD-10-CM | POA: Diagnosis not present

## 2020-07-21 DIAGNOSIS — F1010 Alcohol abuse, uncomplicated: Secondary | ICD-10-CM | POA: Diagnosis not present

## 2020-07-21 DIAGNOSIS — R7301 Impaired fasting glucose: Secondary | ICD-10-CM | POA: Diagnosis not present

## 2020-07-21 DIAGNOSIS — E782 Mixed hyperlipidemia: Secondary | ICD-10-CM | POA: Diagnosis not present

## 2020-09-19 DIAGNOSIS — E1165 Type 2 diabetes mellitus with hyperglycemia: Secondary | ICD-10-CM | POA: Diagnosis not present

## 2020-09-19 DIAGNOSIS — I10 Essential (primary) hypertension: Secondary | ICD-10-CM | POA: Diagnosis not present

## 2020-10-08 DIAGNOSIS — Z0001 Encounter for general adult medical examination with abnormal findings: Secondary | ICD-10-CM | POA: Diagnosis not present

## 2020-10-08 DIAGNOSIS — G301 Alzheimer's disease with late onset: Secondary | ICD-10-CM | POA: Diagnosis not present

## 2020-10-08 DIAGNOSIS — D649 Anemia, unspecified: Secondary | ICD-10-CM | POA: Diagnosis not present

## 2020-10-08 DIAGNOSIS — E782 Mixed hyperlipidemia: Secondary | ICD-10-CM | POA: Diagnosis not present

## 2020-10-08 DIAGNOSIS — R634 Abnormal weight loss: Secondary | ICD-10-CM | POA: Diagnosis not present

## 2020-10-08 DIAGNOSIS — R32 Unspecified urinary incontinence: Secondary | ICD-10-CM | POA: Diagnosis not present

## 2020-10-08 DIAGNOSIS — I10 Essential (primary) hypertension: Secondary | ICD-10-CM | POA: Diagnosis not present

## 2020-10-08 DIAGNOSIS — F1010 Alcohol abuse, uncomplicated: Secondary | ICD-10-CM | POA: Diagnosis not present

## 2020-10-08 DIAGNOSIS — Z6824 Body mass index (BMI) 24.0-24.9, adult: Secondary | ICD-10-CM | POA: Diagnosis not present

## 2020-10-08 DIAGNOSIS — Z0000 Encounter for general adult medical examination without abnormal findings: Secondary | ICD-10-CM | POA: Diagnosis not present

## 2020-10-08 DIAGNOSIS — R7301 Impaired fasting glucose: Secondary | ICD-10-CM | POA: Diagnosis not present

## 2020-10-08 DIAGNOSIS — K5900 Constipation, unspecified: Secondary | ICD-10-CM | POA: Diagnosis not present

## 2020-10-08 DIAGNOSIS — G3184 Mild cognitive impairment, so stated: Secondary | ICD-10-CM | POA: Diagnosis not present

## 2020-10-08 DIAGNOSIS — Z712 Person consulting for explanation of examination or test findings: Secondary | ICD-10-CM | POA: Diagnosis not present

## 2020-10-18 ENCOUNTER — Other Ambulatory Visit: Payer: Self-pay

## 2020-10-18 NOTE — Patient Outreach (Signed)
[facility]) Care Management  10/18/2020  Malik Nicholson 23-Sep-1940 [id number]   Referral Date: 10/12/20 Referral Source: MD referral Referral Reason: Help with placement  Outreach Attempt: No answer. HIPAA compliant voice message left.   Plan: RN CM will attempt again within 4 business days and send a letter.    [name], RN, MSN [facility] Care Management Care Management Coordinator Direct Line [phone] Toll Free: [phone]  Fax: [phone]

## 2020-10-19 ENCOUNTER — Other Ambulatory Visit: Payer: Self-pay
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
## 2020-10-19 NOTE — Patient Instructions (Signed)
Goals Addressed            This Visit's Progress   . Caregiver  will continue to encourage patient to take medication.       Patient on Losartan for blood pressure.   10/19/20 Reviewed signs of stroke and heart attack.  Discussed medication adherence.     . Find Help in My Community       Timeframe:  Short-Term Goal Priority:  High Start Date:     10/19/20                        Expected End Date:    01/19/21                   Follow Up Date 11/19/20   - follow-up on any referrals for help I am given - think ahead to make sure my need does not become an emergency    Why is this important?    Knowing how and where to find help for yourself or family in your neighborhood and community is an important skill.   You will want to take some steps to learn how.    Notes: 10/19/20 Discussed beginning search for assisted living facilities.

## 2020-10-21 DIAGNOSIS — I129 Hypertensive chronic kidney disease with stage 1 through stage 4 chronic kidney disease, or unspecified chronic kidney disease: Secondary | ICD-10-CM | POA: Diagnosis not present

## 2020-10-21 DIAGNOSIS — E1165 Type 2 diabetes mellitus with hyperglycemia: Secondary | ICD-10-CM | POA: Diagnosis not present

## 2020-10-21 DIAGNOSIS — L853 Xerosis cutis: Secondary | ICD-10-CM | POA: Diagnosis not present

## 2020-10-30 ENCOUNTER — Other Ambulatory Visit: Payer: Self-pay

## 2020-10-30 NOTE — Patient Instructions (Addendum)
Goals    . Caregiver  will continue to encourage patient to take medication.     Timeframe:  Long-Range Goal Priority:  High Start Date:      10/19/20                       Expected End Date:    03/22/21                   Patient on Losartan for blood pressure.   10/19/20 Reviewed signs of stroke and heart attack.  Discussed medication adherence.  10/30/20 Reiterated signs of increased blood pressure.     . Find Help in My Community     Timeframe:  Short-Term Goal Priority:  High Start Date:     10/19/20                        Expected End Date:    01/19/21                   Follow Up Date 11/19/20   - have a back-up plan    Why is this important?    Knowing how and where to find help for yourself or family in your neighborhood and community is an important skill.   You will want to take some steps to learn how.    Notes: 10/19/20 Discussed beginning search for assisted living facilities.   10/30/20 Wife holding off on visiting facilities.       Alzheimer's Disease Caregiver Guide Alzheimer's disease is a condition that makes a person:  Forget things.  Act differently.  Have trouble paying attention and doing simple tasks. These things get worse with time. The tips below can help you care for the person. How to help manage lifestyle changes Tips to help with symptoms  Be calm and patient.  Give simple, short answers to questions.  Avoid correcting the person in a negative way.  Try not to take things personally, even if the person forgets your name.  Do not argue with the person. This may make the person more upset. Tips to lessen frustration  Make appointments and do daily tasks when the person is at his or her best.  Take your time. Simple tasks may take longer. Allow plenty of time to complete tasks.  Limit choices for the person.  Involve the person in what you are doing.  Keep things organized: ? Keep a daily routine. ? Organize medicines in a pillbox  for each day of the week. ? Keep a calendar in a central location to remind the person of meetings or other activities.  Avoid new or crowded places, if possible.  Use simple words, short sentences, and a calm voice. Only give one direction at a time.  Buy clothes and shoes that are easy to put on and take off.  Try to change the subject if the person becomes frustrated or angry. Tips to prevent injury  Keep floors clear. Remove rugs, magazine racks, and floor lamps.  Keep hallways well-lit.  Put a handrail and non-slip mat in the bathtub or shower.  Put childproof locks on cabinets that have dangerous items in them. These items include medicine, alcohol, guns, toxic cleaning items, sharp tools, matches, and lighters.  Put locks on doors where the person cannot see or reach them. This helps keep the person from going out of the house and getting lost.  Be ready for emergencies.  Keep a list of emergency phone numbers and addresses close by.  Remove car keys and lock garage doors so that the person does not try to drive.  Bracelets may be worn that track location and identify the person as having memory problems. This should be worn at all times for safety.   Tips for the future  Discuss financial and legal planning early. People with this disease have trouble managing their money as the disease gets worse. Get help from a professional.  Talk about advance directives, safety, and daily care. Take these steps: ? Create a living will and choose a power of attorney. This is someone who can make decisions for the person with Alzheimer's disease when he or she can no longer do so. ? Discuss driving safety and when to stop driving. The person's doctor can help with this. ? If the person lives alone, make sure he or she is safe. Some people need extra help at home. Other people need more care at a nursing home or care center.   How to recognize changes in the person's condition With this  disease, memory problems and confusion slowly get worse. In time, the person may not know his or her friends and family members. The disease can also cause changes in behavior and mood, such as anxiety or anger. The person may see, hear, taste, smell, or feel things that are not real (hallucinate). These changes can come on all of a sudden. They may happen in response to something such as:  Pain.  An infection.  Changes in temperature or noise.  Too much stimulation.  Feeling lost or scared.  Medicines. Where to find support  Find out about services that can provide short-term care (respite care). These can allow you to take a break when you need it.  Join a support group near you. These groups can help you: ? Learn ways to manage stress. ? Share experiences with others. ? Get emotional comfort and support. ? Learn about caregiving as the disease gets worse. ? Know what community resources are available. Where to find more information  Alzheimer's Association: [employer] Contact a doctor if:  The person has a fever.  The person has a sudden behavior change that does not get better with calming strategies.  The person is not able to take care of himself or herself at home.  You are no longer able to care for the person. Get help right away if:  The person has a sudden increase in confusion or new hallucinations.  The person threatens you or anyone else, including himself or herself. Get help right away if you feel like your loved one may hurt himself or herself or others, or has thoughts about taking his or her own life. Go to your nearest emergency room or:  Call your local emergency services (911 in the [place]  Call the [place] at 978 517 7227. This is open 24 hours a day.  Text the Crisis Text Line at 914-276-4380. Summary  Alzheimer's disease causes a person to forget things.  A person who has this condition may have trouble doing  simple tasks.  Take steps to keep the person from getting hurt. Plan for future care.  You can find support by joining a support group near you. This information is not intended to replace advice given to you by your health care provider. Make sure you discuss any questions you have with your health care provider. Document Revised: 09/26/2019 Document Reviewed: 09/26/2019  Elsevier Patient Education  2021 [employer].

## 2020-10-30 NOTE — Patient Outreach (Signed)
[facility]) Care Management  [place]  10/30/2020   Malik Nicholson April 08, 1941 [id number]  Subjective: Telephone call to spouse. She reports patient about the same. She did receive packet of information and has no questions.  She has not toured facilities yet and medicaid still pending.   Discussed Hypertension management.  She verbalized understanding,    Objective:   Encounter Medications:  Outpatient Encounter Medications as of 10/30/2020  Medication Sig  . alfuzosin (UROXATRAL) 10 MG 24 hr tablet Take 1 tablet (10 mg total) by mouth at bedtime. (Patient not taking: Reported on 10/19/2020)  . atorvastatin (LIPITOR) 10 MG tablet Take 10 mg by mouth daily.  Marland Kitchen docusate sodium (COLACE) 50 MG capsule Take 50 mg by mouth 2 (two) times daily. (Patient not taking: Reported on 10/19/2020)  . fluticasone (FLONASE) 50 MCG/ACT nasal spray Place 1 spray into both nostrils daily. (Patient not taking: Reported on 10/19/2020)  . losartan (COZAAR) 50 MG tablet Take 50 mg by mouth daily.  . memantine (NAMENDA) 5 MG tablet  (Patient not taking: Reported on 10/19/2020)   No facility-administered encounter medications on file as of 10/30/2020.    Functional Status:  In your present state of health, do you have any difficulty performing the following activities: 10/19/2020  Hearing? Y  Comment some hearing loss  Vision? N  Difficulty concentrating or making decisions? Y  Comment Alzheimer's  Walking or climbing stairs? N  Dressing or bathing? N  Doing errands, shopping? N  Preparing Food and eating ? N  Using the Toilet? N  In the past six months, have you accidently leaked urine? Y  Comment wear depends  Do you have problems with loss of bowel control? N  Managing your Medications? N  Managing your Finances? N  Housekeeping or managing your Housekeeping? Y  Comment wife does  Some recent data might be hidden    Fall/Depression Screening: Fall Risk  10/19/2020  Falls  in the past year? 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 0  Risk for fall due to : History of fall(s)  Follow up Education provided   [facility] 2/9 Scores 10/19/2020  Exception Documentation Other- indicate reason in comment box  Not completed wife completes assessment    Assessment:  Goals Addressed            This Visit's Progress   . Caregiver  will continue to encourage patient to take medication.   On track    Timeframe:  Long-Range Goal Priority:  High Start Date:      10/19/20                       Expected End Date:    03/22/21                   Patient on Losartan for blood pressure.   10/19/20 Reviewed signs of stroke and heart attack.  Discussed medication adherence.  10/30/20 Reiterated signs of increased blood pressure.     . Find Help in My Community   On track    Timeframe:  Short-Term Goal Priority:  High Start Date:     10/19/20                        Expected End Date:    01/19/21  Follow Up Date 11/19/20   - have a back-up plan    Why is this important?    Knowing how and where to find help for yourself or family in your neighborhood and community is an important skill.   You will want to take some steps to learn how.    Notes: 10/19/20 Discussed beginning search for assisted living facilities.   10/30/20 Wife holding off on visiting facilities.       Plan: RN CM will follow up in June. Follow-up: Caregiver/ Patient agrees to Care Plan and Follow-up.   [name], RN, MSN [place] Management Care Management Coordinator Direct Line [phone] Cell [phone] Toll Free: [phone]  Fax: [phone]

## 2020-11-16 DIAGNOSIS — L853 Xerosis cutis: Secondary | ICD-10-CM | POA: Diagnosis not present

## 2020-11-28 ENCOUNTER — Other Ambulatory Visit: Payer: Self-pay

## 2020-11-28 NOTE — Patient Outreach (Signed)
[facility]) Care Management  11/28/2020  TALLIN HART [birth year] [id number]   Telephone call to patient for disease management follow up.   No answer.  HIPAA compliant voice message left.    Plan: If no return call, RN CM will attempt patient again in July.   [name], RN, MSN [place] Management Care Management Coordinator Direct Line [phone] Cell [phone] Toll Free: [phone]  Fax: [phone]

## 2020-12-20 DIAGNOSIS — I129 Hypertensive chronic kidney disease with stage 1 through stage 4 chronic kidney disease, or unspecified chronic kidney disease: Secondary | ICD-10-CM | POA: Diagnosis not present

## 2020-12-20 DIAGNOSIS — E1165 Type 2 diabetes mellitus with hyperglycemia: Secondary | ICD-10-CM | POA: Diagnosis not present

## 2020-12-27 ENCOUNTER — Other Ambulatory Visit: Payer: Self-pay

## 2020-12-27 NOTE — Patient Outreach (Signed)
[facility]) Care Management  12/27/2020  Malik Nicholson [birth year] [id number]   Telephone call for disease management follow up. Phone busy after several attempts.    Plan: RN CM will attempt patient again in the month of September and send letter.  [name], RN, MSN [place] Management Care Management Coordinator Direct Line [phone] Cell [phone] Toll Free: [phone]  Fax: [phone]

## 2021-01-01 ENCOUNTER — Ambulatory Visit: Payer: Self-pay

## 2021-02-05 DIAGNOSIS — E1165 Type 2 diabetes mellitus with hyperglycemia: Secondary | ICD-10-CM | POA: Diagnosis not present

## 2021-02-05 DIAGNOSIS — I129 Hypertensive chronic kidney disease with stage 1 through stage 4 chronic kidney disease, or unspecified chronic kidney disease: Secondary | ICD-10-CM | POA: Diagnosis not present

## 2021-02-26 ENCOUNTER — Other Ambulatory Visit: Payer: Self-pay

## 2021-02-26 NOTE — Patient Outreach (Signed)
[place] [facility]) Care Management  [place]  02/26/2021   SHAHZAIN BIRELEY 1940-09-05 [id number]  Subjective: Spoke with spouse she reports patient doing good.  She has decided not to pursue placement.  Hypertension management continues.    Objective:   Encounter Medications:  Outpatient Encounter Medications as of 02/26/2021  Medication Sig   alfuzosin (UROXATRAL) 10 MG 24 hr tablet Take 1 tablet (10 mg total) by mouth at bedtime. (Patient not taking: Reported on 10/19/2020)   atorvastatin (LIPITOR) 10 MG tablet Take 10 mg by mouth daily.   docusate sodium (COLACE) 50 MG capsule Take 50 mg by mouth 2 (two) times daily. (Patient not taking: Reported on 10/19/2020)   fluticasone (FLONASE) 50 MCG/ACT nasal spray Place 1 spray into both nostrils daily. (Patient not taking: Reported on 10/19/2020)   losartan (COZAAR) 50 MG tablet Take 50 mg by mouth daily.   memantine (NAMENDA) 5 MG tablet  (Patient not taking: Reported on 10/19/2020)   No facility-administered encounter medications on file as of 02/26/2021.    Functional Status:  In your present state of health, do you have any difficulty performing the following activities: 10/19/2020  Hearing? Y  Comment some hearing loss  Vision? N  Difficulty concentrating or making decisions? Y  Comment Alzheimer's  Walking or climbing stairs? N  Dressing or bathing? N  Doing errands, shopping? N  Preparing Food and eating ? N  Using the Toilet? N  In the past six months, have you accidently leaked urine? Y  Comment wear depends  Do you have problems with loss of bowel control? N  Managing your Medications? N  Managing your Finances? N  Housekeeping or managing your Housekeeping? Y  Comment wife does  Some recent data might be hidden    Fall/Depression Screening: Fall Risk  10/19/2020  Falls in the past year? 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 0   Risk for fall due to : History of fall(s)  Follow up Education provided   [facility] 2/9 Scores 10/19/2020  Exception Documentation Other- indicate reason in comment box  Not completed wife completes assessment    Assessment:   Care Plan Care Plan : Hypertension (Adult)  Updates made by Jon Billings, RN since 02/26/2021 12:00 AM     Problem: Disease Progression (Hypertension)      Long-Range Goal: Disease Progression Prevented or Minimized as evidenced by no acute hypertensive episodes.   Start Date: 10/19/2020  Expected End Date: 09/20/2021  This Visit's Progress: On track  Priority: High  Note:   Barriers: Knowledge Psychological Impairment Evidence-based guidance: Promote a healthy diet that includes primarily plant-based foods, such as fruits, vegetables, whole grains, beans and legumes, low-fat dairy and lean meats.   Consider moderate reduction in sodium intake by avoiding the addition of salt to prepared foods and limiting processed meats, canned soup, frozen meals and salty snacks.   Promote a regular, daily exercise goal of 150 minutes per week of moderate exercise based on tolerance, ability and patient choice; consider referral to physical therapist, community wellness and/or activity program.  Notes:     Task: Alleviate Barriers to Hypertension Treatment   Due Date: 03/22/2021  Priority: Routine  Responsible User: Jon Billings, RN  Note:   Care Management Activities:    - healthy diet promoted - healthy family lifestyle promoted    Notes:  10/19/20 Patient takes medication per  spouse but does not monitor blood pressure.  Discussed signs of high blood pressure with spouse.  10/30/20 Reiterated signs of increased blood pressure.   02/26/21 Wife reports blood pressure has not been high but does not give numbers.   Hypertension Management Discussed Take medications as ordered Limit salt intake. Eat plenty of fruits and vegetables Remain as active as possible Take blood  pressure at least weekly at home and record in a notebook to take to doctor's visits.  Encouraged continued hypertension management.        Goals Addressed             This Visit's Progress    Caregiver  will continue to encourage patient to take medication.   On track    Timeframe:  Long-Range Goal Priority:  High Start Date:      10/19/20                       Expected End Date:    09/20/21                   Patient on Losartan for blood pressure.   10/19/20 Reviewed signs of stroke and heart attack.  Discussed medication adherence.  10/30/20 Reiterated signs of increased blood pressure.  02/26/21 Hypertension Management Discussed Take medications as ordered Limit salt intake. Eat plenty of fruits and vegetables Remain as active as possible Take blood pressure at least weekly at home and record in a notebook to take to doctor's visits.      COMPLETED: Find Help in My Community       Timeframe:  Short-Term Goal Priority:  High Start Date:     10/19/20                        Expected End Date:    01/19/21                   Follow Up Date 11/19/20   - have a back-up plan    Why is this important?   Knowing how and where to find help for yourself or family in your neighborhood and community is an important skill.  You will want to take some steps to learn how.    Notes: 10/19/20 Discussed beginning search for assisted living facilities.   10/30/20 Wife holding off on visiting facilities. 02/26/21 Wife has decided not to pursue placement at this time.          Plan:  Follow-up: Patient agrees to Care Plan and Follow-up. Follow-up in 3 month(s)  [name], RN, MSN [place] Management Care Management Coordinator Direct Line [phone] Cell [phone] Toll Free: [phone]  Fax: [phone]

## 2021-03-07 ENCOUNTER — Ambulatory Visit: Payer: Self-pay

## 2021-03-21 DIAGNOSIS — I10 Essential (primary) hypertension: Secondary | ICD-10-CM | POA: Diagnosis not present

## 2021-03-21 DIAGNOSIS — E119 Type 2 diabetes mellitus without complications: Secondary | ICD-10-CM | POA: Diagnosis not present

## 2021-03-26 DIAGNOSIS — K5909 Other constipation: Secondary | ICD-10-CM | POA: Diagnosis not present

## 2021-03-26 DIAGNOSIS — I10 Essential (primary) hypertension: Secondary | ICD-10-CM | POA: Diagnosis not present

## 2021-03-26 DIAGNOSIS — Z23 Encounter for immunization: Secondary | ICD-10-CM | POA: Diagnosis not present

## 2021-03-26 DIAGNOSIS — E782 Mixed hyperlipidemia: Secondary | ICD-10-CM | POA: Diagnosis not present

## 2021-03-26 DIAGNOSIS — G309 Alzheimer's disease, unspecified: Secondary | ICD-10-CM | POA: Diagnosis not present

## 2021-03-26 DIAGNOSIS — R634 Abnormal weight loss: Secondary | ICD-10-CM | POA: Diagnosis not present

## 2021-03-26 DIAGNOSIS — F0281 Dementia in other diseases classified elsewhere with behavioral disturbance: Secondary | ICD-10-CM | POA: Diagnosis not present

## 2021-03-26 DIAGNOSIS — D649 Anemia, unspecified: Secondary | ICD-10-CM | POA: Diagnosis not present

## 2021-03-26 DIAGNOSIS — R7301 Impaired fasting glucose: Secondary | ICD-10-CM | POA: Diagnosis not present

## 2021-03-26 DIAGNOSIS — R32 Unspecified urinary incontinence: Secondary | ICD-10-CM | POA: Diagnosis not present

## 2021-03-26 DIAGNOSIS — F1010 Alcohol abuse, uncomplicated: Secondary | ICD-10-CM | POA: Diagnosis not present

## 2021-04-22 DIAGNOSIS — I129 Hypertensive chronic kidney disease with stage 1 through stage 4 chronic kidney disease, or unspecified chronic kidney disease: Secondary | ICD-10-CM | POA: Diagnosis not present

## 2021-04-22 DIAGNOSIS — E1165 Type 2 diabetes mellitus with hyperglycemia: Secondary | ICD-10-CM | POA: Diagnosis not present

## 2021-05-28 ENCOUNTER — Other Ambulatory Visit: Payer: Self-pay

## 2021-05-28 NOTE — Patient Outreach (Signed)
[facility]) Care Management  [place]  05/28/2021   Malik Nicholson 1941/05/12 [id number]  Subjective: Telephone call to spouse.  She reports that patient is doing fairly well.  She reports patient is no longer driving.  She reports managing dementia has been okay right now.  She reports that she is managing well.  Discussed dementia and care. She states patient blood pressure has been good but does not give numbers but agrees it is less than 140/80.  No concerns.    Objective:   Encounter Medications:  Outpatient Encounter Medications as of 05/28/2021  Medication Sig   alfuzosin (UROXATRAL) 10 MG 24 hr tablet Take 1 tablet (10 mg total) by mouth at bedtime.   atorvastatin (LIPITOR) 10 MG tablet Take 10 mg by mouth daily.   docusate sodium (COLACE) 50 MG capsule Take 50 mg by mouth 2 (two) times daily.   fluticasone (FLONASE) 50 MCG/ACT nasal spray Place 1 spray into both nostrils daily.   losartan (COZAAR) 50 MG tablet Take 50 mg by mouth daily.   memantine (NAMENDA) 5 MG tablet    No facility-administered encounter medications on file as of 05/28/2021.    Functional Status:  In your present state of health, do you have any difficulty performing the following activities: 05/28/2021 10/19/2020  Hearing? Y Y  Comment some hearing loss some hearing loss  Vision? N N  Difficulty concentrating or making decisions? Y Y  Comment Alzheimer's Alzheimer's  Walking or climbing stairs? N N  Dressing or bathing? N N  Doing errands, shopping? N N  Preparing Food and eating ? N N  Using the Toilet? N N  In the past six months, have you accidently leaked urine? Tempie Donning  Comment wears depends wear depends  Do you have problems with loss of bowel control? N N  Managing your Medications? N N  Managing your Finances? N N  Housekeeping or managing your Housekeeping? Y Y  Comment Wife does wife does  Some recent data might be hidden    Fall/Depression Screening: Fall  Risk  05/28/2021 10/19/2020  Falls in the past year? 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 - 0  Risk for fall due to : - History of fall(s)  Follow up - Education provided   [facility] 2/9 Scores 05/28/2021 10/19/2020  Exception Documentation Other- indicate reason in comment box Other- indicate reason in comment box  Not completed wife completes assessment wife completes assessment    Assessment:   Care Plan Care Plan : Hypertension (Adult)  Updates made by Jon Billings, RN since 05/28/2021 12:00 AM  Completed 05/28/2021   Problem: Disease Progression (Hypertension) Resolved 05/28/2021     Long-Range Goal: Disease Progression Prevented or Minimized as evidenced by no acute hypertensive episodes. Completed 05/28/2021  Start Date: 10/19/2020  Expected End Date: 09/20/2021  Recent Progress: On track  Priority: High  Note:   Barriers: Knowledge Psychological Impairment Evidence-based guidance: Promote a healthy diet that includes primarily plant-based foods, such as fruits, vegetables, whole grains, beans and legumes, low-fat dairy and lean meats.   Consider moderate reduction in sodium intake by avoiding the addition of salt to prepared foods and limiting processed meats, canned soup, frozen meals and salty snacks.   Promote a regular, daily exercise goal of 150 minutes per week of moderate exercise based on tolerance, ability and patient choice; consider  referral to physical therapist, community wellness and/or activity program.  Notes: 49/6/75 Resolving duplicate goal    Task: Alleviate Barriers to Hypertension Treatment Completed 05/28/2021  Due Date: 03/22/2021  Priority: Routine  Responsible User: Jon Billings, RN  Note:   Care Management Activities:    - healthy diet promoted - healthy family lifestyle promoted    Notes:  10/19/20 Patient takes medication per spouse but does not monitor blood pressure.   Discussed signs of high blood pressure with spouse.  10/30/20 Reiterated signs of increased blood pressure.   02/26/21 Wife reports blood pressure has not been high but does not give numbers.   Hypertension Management Discussed Take medications as ordered Limit salt intake. Eat plenty of fruits and vegetables Remain as active as possible Take blood pressure at least weekly at home and record in a notebook to take to doctor's visits.  Encouraged continued hypertension management.      Care Plan : RN Care Manager Plan of Care  Updates made by Jon Billings, RN since 05/28/2021 12:00 AM     Problem: Chronic Disease Management of Hypertension and Dementia   Priority: High     Long-Range Goal: Develop Plan of Care for management of Hypertension and Dementia   Start Date: 05/28/2021  Expected End Date: 06/22/2022  Priority: High  Note:   Current Barriers:  Knowledge Deficits related to plan of care for management of Dementia  Chronic Disease Management support and education needs related to HTN   RNCM Clinical Goal(s):  Patient will verbalize understanding of plan for management of HTN and Dementia as evidenced by Blood pressure less than 140/80 and wife reports dementia and behaviors manageable continue to work with RN Care Manager to address care management and care coordination needs related to  HTN and Dementia as evidenced by adherence to CM Team Scheduled appointments through collaboration with RN Care manager, provider, and care team.   Interventions: Education and support related to HTN and Dementia Inter-disciplinary care team collaboration (see longitudinal plan of care) Evaluation of current treatment plan related to  self management and patient's adherence to plan as established by provider   Hypertension Interventions:  (Status:  New goal.) Long Term Goal Last practice recorded BP readings:  BP Readings from Last 3 Encounters:  05/25/20 (!) 144/72  01/25/16 (!) 158/85   02/14/14 128/74  Most recent eGFR/CrCl: No results found for: EGFR  No components found for: CRCL  Evaluation of current treatment plan related to hypertension self management and patient's adherence to plan as established by provider Reviewed medications with patient and discussed importance of compliance Discussed plans with patient for ongoing care management follow up and provided patient with direct contact information for care management team  Dementia:  (Status:  New goal.)  Long Term Goal Evaluation of current treatment plan related to misuse of: Alzheimer's dementia Emotional Support Provided to patient/caregiver and Advised to contact provider for new or worsening symptoms  Patient Goals/Self-Care Activities: Hypertension check blood pressure weekly keep a blood pressure log take blood pressure log to all doctor appointments eat more whole grains, fruits and vegetables, lean meats and healthy fats Limit Salt intake Remain active  Patient Goals/Self-Care Activities: Dementia Maintain safety in the home Stay Physically Active Contact physician for changes Continue to live at home  Follow Up Plan:  Telephone follow up appointment with care management team member scheduled for:  March The patient has been provided with contact information for the care management team and has been advised  to call with any health related questions or concerns.        Goals Addressed             This Visit's Progress    COMPLETED: Caregiver  will continue to encourage patient to take medication.       Timeframe:  Long-Range Goal Priority:  High Start Date:      10/19/20                       Expected End Date:    09/20/21                   Patient on Losartan for blood pressure.   10/19/20 Reviewed signs of stroke and heart attack.  Discussed medication adherence.  10/30/20 Reiterated signs of increased blood pressure.  02/26/21 Hypertension Management Discussed Take medications as  ordered Limit salt intake. Eat plenty of fruits and vegetables Remain as active as possible Take blood pressure at least weekly at home and record in a notebook to take to doctor's visits.  73/4/19 Resolving duplicate goal        Plan:  Follow-up: Patient agrees to Care Plan and Follow-up. Follow-up in 3 month(s)  [name], RN, MSN [place] [facility] Care Management Coordinator Direct Line [phone] Cell [phone] Toll Free: [phone]  Fax: [phone]

## 2021-05-28 NOTE — Patient Instructions (Signed)
Patient Goals/Self-Care Activities: Hypertension check blood pressure weekly keep a blood pressure log take blood pressure log to all doctor appointments eat more whole grains, fruits and vegetables, lean meats and healthy fats Limit Salt intake Remain active  Patient Goals/Self-Care Activities: Dementia Maintain safety in the home Stay Physically Active Contact physician for changes Continue to live at home

## 2021-06-21 DIAGNOSIS — R7301 Impaired fasting glucose: Secondary | ICD-10-CM | POA: Diagnosis not present

## 2021-06-21 DIAGNOSIS — E782 Mixed hyperlipidemia: Secondary | ICD-10-CM | POA: Diagnosis not present

## 2021-06-27 DIAGNOSIS — K5909 Other constipation: Secondary | ICD-10-CM | POA: Diagnosis not present

## 2021-06-27 DIAGNOSIS — R7301 Impaired fasting glucose: Secondary | ICD-10-CM | POA: Diagnosis not present

## 2021-06-27 DIAGNOSIS — D649 Anemia, unspecified: Secondary | ICD-10-CM | POA: Diagnosis not present

## 2021-06-27 DIAGNOSIS — E782 Mixed hyperlipidemia: Secondary | ICD-10-CM | POA: Diagnosis not present

## 2021-06-27 DIAGNOSIS — F0281 Dementia in other diseases classified elsewhere with behavioral disturbance: Secondary | ICD-10-CM | POA: Diagnosis not present

## 2021-06-27 DIAGNOSIS — G309 Alzheimer's disease, unspecified: Secondary | ICD-10-CM | POA: Diagnosis not present

## 2021-06-27 DIAGNOSIS — I10 Essential (primary) hypertension: Secondary | ICD-10-CM | POA: Diagnosis not present

## 2021-06-27 DIAGNOSIS — R32 Unspecified urinary incontinence: Secondary | ICD-10-CM | POA: Diagnosis not present

## 2021-06-27 DIAGNOSIS — Z0001 Encounter for general adult medical examination with abnormal findings: Secondary | ICD-10-CM | POA: Diagnosis not present

## 2021-06-27 DIAGNOSIS — F1010 Alcohol abuse, uncomplicated: Secondary | ICD-10-CM | POA: Diagnosis not present

## 2021-06-27 DIAGNOSIS — F02811 Dementia in other diseases classified elsewhere, unspecified severity, with agitation: Secondary | ICD-10-CM | POA: Diagnosis not present

## 2021-07-15 DIAGNOSIS — L299 Pruritus, unspecified: Secondary | ICD-10-CM | POA: Diagnosis not present

## 2021-08-21 ENCOUNTER — Other Ambulatory Visit: Payer: Self-pay

## 2021-08-21 NOTE — Patient Instructions (Signed)
Patient Goals/Self-Care Activities: Hypertension ?eat more whole grains, fruits and vegetables, lean meats and healthy fats ?Limit Salt intake ?Remain active ? ?Patient Goals/Self-Care Activities: Dementia ?Maintain safety in the home ?Stay Physically Active ?Contact physician for changes ?Continue to live at home ?

## 2021-08-21 NOTE — Patient Outreach (Signed)
[facility]) Care Management ? ?08/21/2021 ? ?Malik Nicholson ?[birth year] ?[id number] ? ? ?Telephone call to spouse Parke Simmers for disease management follow up. She reports patient is doing pretty good.  She states he just stays around the home.  Some periods of agitation around telling him to bathe.  Discussed dementia and agitation. Blood pressure continues to do well.  They do not check at home.  Last check at physician office was 124/74.  Discussed hypertension management.  No concerns.   ? ? ?Care Plan : RN Care Manager Plan of Care  ?Updates made by Jon Billings, RN since 08/21/2021 12:00 AM  ?  ? ?Problem: Chronic Disease Management of Hypertension and Dementia   ?Priority: High  ?  ? ?Long-Range Goal: Develop Plan of Care for management of Hypertension and Dementia   ?Start Date: 05/28/2021  ?Expected End Date: 06/22/2022  ?This Visit's Progress: On track  ?Priority: High  ?Note:   ?Current Barriers:  ?Knowledge Deficits related to plan of care for management of Dementia  ?Chronic Disease Management support and education needs related to HTN  ? ?RNCM Clinical Goal(s):  ?Patient will verbalize understanding of plan for management of HTN and Dementia as evidenced by Blood pressure less than 140/80 and wife reports dementia and behaviors manageable ?continue to work with RN Care Manager to address care management and care coordination needs related to  HTN and Dementia as evidenced by adherence to CM Team Scheduled appointments through collaboration with RN Care manager, provider, and care team.  ? ?Interventions: ?Education and support related to HTN and Dementia ?Inter-disciplinary care team collaboration (see longitudinal plan of care) ?Evaluation of current treatment plan related to  self management and patient's adherence to plan as established by provider ? ? ?Hypertension Interventions:  (Status:  New goal.) Long Term Goal ?Last practice recorded BP readings:  ?BP Readings from Last 3 Encounters:   ?05/25/20 (!) 144/72  ?01/25/16 (!) 158/85  ?02/14/14 128/74  ?Most recent eGFR/CrCl: No results found for: EGFR  No components found for: CRCL ? ?Evaluation of current treatment plan related to hypertension self management and patient's adherence to plan as established by provider ?Reviewed medications with patient and discussed importance of compliance ?Discussed plans with patient for ongoing care management follow up and provided patient with direct contact information for care management team ? ?Dementia:  (Status:  Goal on track:  Yes.)  Long Term Goal ?Evaluation of current treatment plan related to misuse of: Alzheimer's dementia ?Emotional Support Provided to patient/caregiver and Advised to contact provider for new or worsening symptoms ? ?Patient Goals/Self-Care Activities: Hypertension ?eat more whole grains, fruits and vegetables, lean meats and healthy fats ?Limit Salt intake ?Remain active ? ?Patient Goals/Self-Care Activities: Dementia ?Maintain safety in the home ?Stay Physically Active ?Contact physician for changes ?Continue to live at home ? ?Follow Up Plan:  Telephone follow up appointment with care management team member scheduled for:  June ?The patient has been provided with contact information for the care management team and has been advised to call with any health related questions or concerns.  ? ?  ? ?Plan: Follow-up: Patient agrees to Care Plan and Follow-up. ?Follow-up in 3 months. ? ?[name], RN, MSN ?[facility] Care Management ?Care Management Coordinator ?Direct Line [phone] ?Toll Free: [phone]  ?Fax: [phone] ? ? ?

## 2021-08-22 ENCOUNTER — Ambulatory Visit: Payer: Self-pay

## 2021-10-30 DIAGNOSIS — R7301 Impaired fasting glucose: Secondary | ICD-10-CM | POA: Diagnosis not present

## 2021-10-30 DIAGNOSIS — D649 Anemia, unspecified: Secondary | ICD-10-CM | POA: Diagnosis not present

## 2021-10-30 DIAGNOSIS — F1010 Alcohol abuse, uncomplicated: Secondary | ICD-10-CM | POA: Diagnosis not present

## 2021-10-30 DIAGNOSIS — R634 Abnormal weight loss: Secondary | ICD-10-CM | POA: Diagnosis not present

## 2021-10-30 DIAGNOSIS — K5909 Other constipation: Secondary | ICD-10-CM | POA: Diagnosis not present

## 2021-10-30 DIAGNOSIS — I10 Essential (primary) hypertension: Secondary | ICD-10-CM | POA: Diagnosis not present

## 2021-10-30 DIAGNOSIS — G309 Alzheimer's disease, unspecified: Secondary | ICD-10-CM | POA: Diagnosis not present

## 2021-10-30 DIAGNOSIS — E782 Mixed hyperlipidemia: Secondary | ICD-10-CM | POA: Diagnosis not present

## 2021-10-30 DIAGNOSIS — R6889 Other general symptoms and signs: Secondary | ICD-10-CM | POA: Diagnosis not present

## 2021-10-30 DIAGNOSIS — R32 Unspecified urinary incontinence: Secondary | ICD-10-CM | POA: Diagnosis not present

## 2021-11-21 ENCOUNTER — Other Ambulatory Visit: Payer: Self-pay

## 2021-11-21 NOTE — Patient Outreach (Signed)
[facility] [facility]) Care Management  11/21/2021  JAMAR WEATHERALL 01-30-1941 [id number]   Telephone call for disease management follow up. No answer.  Unable to leave a message.  Plan: RN CM will attempt again in the month off September.  [name], RN, MSN [facility] Care Management Care Management Coordinator Direct Line [phone] Toll Free: [phone]  Fax: [phone]

## 2021-12-25 DIAGNOSIS — R451 Restlessness and agitation: Secondary | ICD-10-CM | POA: Diagnosis not present

## 2021-12-25 DIAGNOSIS — G309 Alzheimer's disease, unspecified: Secondary | ICD-10-CM | POA: Diagnosis not present

## 2021-12-27 ENCOUNTER — Emergency Department (HOSPITAL_COMMUNITY)
Admission: EM | Admit: 2021-12-27 | Discharge: 2022-01-08 | Disposition: A | Discharge status: Home / Self Care | Payer: Medicare HMO | Attending: Emergency Medicine | Admitting: Emergency Medicine

## 2021-12-27 ENCOUNTER — Other Ambulatory Visit: Payer: Self-pay

## 2021-12-27 ENCOUNTER — Emergency Department (HOSPITAL_COMMUNITY): Payer: Medicare HMO

## 2021-12-27 DIAGNOSIS — R0602 Shortness of breath: Secondary | ICD-10-CM | POA: Diagnosis not present

## 2021-12-27 DIAGNOSIS — J705 Respiratory conditions due to smoke inhalation: Secondary | ICD-10-CM | POA: Diagnosis not present

## 2021-12-27 DIAGNOSIS — T59811A Toxic effect of smoke, accidental (unintentional), initial encounter: Secondary | ICD-10-CM | POA: Insufficient documentation

## 2021-12-27 DIAGNOSIS — F0390 Unspecified dementia without behavioral disturbance: Secondary | ICD-10-CM | POA: Insufficient documentation

## 2021-12-27 DIAGNOSIS — R69 Illness, unspecified: Secondary | ICD-10-CM | POA: Diagnosis not present

## 2021-12-27 DIAGNOSIS — R5381 Other malaise: Secondary | ICD-10-CM | POA: Diagnosis not present

## 2021-12-27 DIAGNOSIS — R9431 Abnormal electrocardiogram [ECG] [EKG]: Secondary | ICD-10-CM | POA: Diagnosis not present

## 2021-12-27 LAB — CBC WITH DIFFERENTIAL/PLATELET
Abs Immature Granulocytes: 0.02 10*3/uL (ref 0.00–0.07)
Basophils Absolute: 0 10*3/uL (ref 0.0–0.1)
Basophils Relative: 1 %
Eosinophils Absolute: 0.3 10*3/uL (ref 0.0–0.5)
Eosinophils Relative: 4 %
HCT: 39.7 % (ref 39.0–52.0)
Hemoglobin: 12.9 g/dL — ABNORMAL LOW (ref 13.0–17.0)
Immature Granulocytes: 0 %
Lymphocytes Relative: 25 %
Lymphs Abs: 1.5 10*3/uL (ref 0.7–4.0)
MCH: 27.8 pg (ref 26.0–34.0)
MCHC: 32.5 g/dL (ref 30.0–36.0)
MCV: 85.6 fL (ref 80.0–100.0)
Monocytes Absolute: 0.6 10*3/uL (ref 0.1–1.0)
Monocytes Relative: 10 %
Neutro Abs: 3.7 10*3/uL (ref 1.7–7.7)
Neutrophils Relative %: 60 %
Platelets: 209 10*3/uL (ref 150–400)
RBC: 4.64 MIL/uL (ref 4.22–5.81)
RDW: 15 % (ref 11.5–15.5)
WBC: 6.2 10*3/uL (ref 4.0–10.5)
nRBC: 0 % (ref 0.0–0.2)

## 2021-12-27 LAB — BASIC METABOLIC PANEL
Anion gap: 8 (ref 5–15)
BUN: 19 mg/dL (ref 8–23)
CO2: 22 mmol/L (ref 22–32)
Calcium: 8.7 mg/dL — ABNORMAL LOW (ref 8.9–10.3)
Chloride: 108 mmol/L (ref 98–111)
Creatinine, Ser: 1.01 mg/dL (ref 0.61–1.24)
GFR, Estimated: >60 mL/min (ref >60)
Glucose, Bld: 112 mg/dL — ABNORMAL HIGH (ref 70–99)
Potassium: 3.6 mmol/L (ref 3.5–5.1)
Sodium: 138 mmol/L (ref 135–145)

## 2021-12-27 LAB — COOXEMETRY PANEL
Carboxyhemoglobin: 9.8 % (ref 0.5–1.5)
Methemoglobin: 1.4 % (ref 0.0–1.5)
O2 Saturation: 88.5 %
Total hemoglobin: 13.6 g/dL (ref 12.0–16.0)

## 2021-12-27 MED ORDER — LORAZEPAM 2 MG/ML IJ SOLN
2.0000 mg | Freq: Once | INTRAMUSCULAR | Status: AC
  Administered 2021-12-27: 2 mg via INTRAMUSCULAR

## 2021-12-27 MED ORDER — HALOPERIDOL LACTATE 5 MG/ML IJ SOLN
2.0000 mg | Freq: Once | INTRAMUSCULAR | Status: AC
  Administered 2021-12-27: 2 mg via INTRAMUSCULAR
  Filled 2021-12-27: qty 1

## 2021-12-27 MED ORDER — MEMANTINE HCL 10 MG PO TABS
5.0000 mg | ORAL_TABLET | Freq: Every day | ORAL | Status: DC
  Administered 2021-12-27 – 2022-01-08 (×13): 5 mg via ORAL
  Filled 2021-12-27 (×13): qty 1

## 2021-12-27 MED ORDER — DOCUSATE SODIUM 50 MG/5ML PO LIQD
50.0000 mg | Freq: Two times a day (BID) | ORAL | Status: DC
  Administered 2021-12-27 – 2022-01-03 (×15): 50 mg via ORAL
  Filled 2021-12-27 (×16): qty 10

## 2021-12-27 MED ORDER — FLUTICASONE PROPIONATE 50 MCG/ACT NA SUSP
1.0000 | Freq: Every day | NASAL | Status: DC
  Administered 2021-12-29 – 2022-01-08 (×11): 1 via NASAL
  Filled 2021-12-27 (×3): qty 16

## 2021-12-27 MED ORDER — LOSARTAN POTASSIUM 25 MG PO TABS
50.0000 mg | ORAL_TABLET | Freq: Every day | ORAL | Status: DC
  Administered 2021-12-27 – 2022-01-08 (×12): 50 mg via ORAL
  Filled 2021-12-27 (×13): qty 2

## 2021-12-27 MED ORDER — ALFUZOSIN HCL ER 10 MG PO TB24
10.0000 mg | ORAL_TABLET | Freq: Every day | ORAL | Status: DC
  Administered 2021-12-27 – 2022-01-06 (×10): 10 mg via ORAL
  Filled 2021-12-27 (×10): qty 1

## 2021-12-27 MED ORDER — LORAZEPAM 2 MG/ML IJ SOLN
2.0000 mg | Freq: Once | INTRAMUSCULAR | Status: DC
  Filled 2021-12-27: qty 1

## 2021-12-27 MED ORDER — ATORVASTATIN CALCIUM 10 MG PO TABS
10.0000 mg | ORAL_TABLET | Freq: Every day | ORAL | Status: DC
  Administered 2021-12-27 – 2022-01-08 (×13): 10 mg via ORAL
  Filled 2021-12-27 (×13): qty 1
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
## 2021-12-27 NOTE — ED Notes (Addendum)
Spoke with wife Parke Simmers by phone. Wife unable to follow/ come to ED. Does not have a ride. Verbalizes pt has Alzheimer's and has confusion and is repetitive. Pt remains alert, NAD, calm, interactive, respe e/u, speaking in clear complete sentences, cap refill <2sec, LS CTA, no cough, nares and throat unremarkable/ clear.
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
## 2021-12-27 NOTE — ED Notes (Signed)
Pt placed on 4L oxygen via [place]

## 2021-12-27 NOTE — ED Notes (Signed)
Date and time results received: 12/27/21 0748 (use smartphrase ".now" to insert current time)  Test: carboxy hgb Critical Value: 9.8  Name of Provider Notified: Dr.Zammit  Orders Received? Or Actions Taken?: no/na

## 2021-12-27 NOTE — ED Notes (Addendum)
Returned to monitor and O2, re-oriented. Moved to room in plain view and closer to RN station.

## 2021-12-27 NOTE — ED Provider Notes (Signed)
Patient's wife who is healthcare power of attorney has refused to take the patient back and wants him placed into a long-term care facility   Milton Ferguson, MD 12/27/21 1321

## 2021-12-27 NOTE — ED Notes (Signed)
EDP notified of CO hgb results, high 9.8

## 2021-12-27 NOTE — ED Notes (Signed)
Spoke with Morey Hummingbird from [facility] and she states she spoke with the wife of the patient and she is not coming to get the patient and no one in the family is coming to get the patient.  Morey Hummingbird states she will have to file some papers with the court but they wont be open until Monday.

## 2021-12-27 NOTE — ED Notes (Signed)
Pt walking around in room and attempting to walk out of room. Sitter and this RN try and redirect pt with no success. Pt is starting to become irritated at staff and likely to have possible fall. Sitter watching pt at this time and will notify EDP for orders.

## 2021-12-27 NOTE — ED Notes (Signed)
Returned to monitor and O2, re-oriented.

## 2021-12-27 NOTE — ED Notes (Signed)
Call to home/ pt spouse x2, no answer, unable to leave message (1200 & 12:15). Tried cell and home, x2.

## 2021-12-27 NOTE — ED Notes (Signed)
Pt placed on 15L oxygen via NRB
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
## 2021-12-27 NOTE — ED Notes (Signed)
Contacted wife, Parke Simmers, to notify of discharge and ask how he can get home. She stated he could not return to the home as it was not livable at this time. She stated he needed to be placed in assisted living or some other type facility, and that a Education officer, museum or case worker needed to be involved for placement. When asked where she was calling me from, she stated at the home. When I asked where she was going, she would not tell me. VM left with SW, MD made aware. MD stated to return call to wife and advise her he needed to be sent to another type facility as outpt by PCP, as he did not meet admission criteria

## 2021-12-27 NOTE — ED Notes (Signed)
Radiology at bedside

## 2021-12-27 NOTE — ED Notes (Signed)
Pt found standing next to bed, had removed self from monitor, oxysensor, BP cuff, O2 and gown. Repetitive confusion/statements. Returned to monitoring and O2. Re-oriented. Back to stretcher.

## 2021-12-27 NOTE — Discharge Instructions (Addendum)
Follow up with the nursing home doctors and your primary care doctor.  Continue your current medications

## 2021-12-27 NOTE — ED Notes (Signed)
Pt is increasingly aggitated, trying to wonder into other pt's rooms. Pt is not easily redirectable. Pt trying to get into dirty linen. EDP notified.

## 2021-12-27 NOTE — ED Notes (Signed)
Spoke with Malik Nicholson at [employer]- he will contact wife, Malik Nicholson, and discuss the situation. Awaiting response
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
## 2021-12-28 MED ORDER — LORAZEPAM 1 MG PO TABS
1.0000 mg | ORAL_TABLET | Freq: Once | ORAL | Status: AC
  Administered 2021-12-28: 1 mg via ORAL
  Filled 2021-12-28: qty 1

## 2021-12-28 MED ORDER — HALOPERIDOL LACTATE 5 MG/ML IJ SOLN
5.0000 mg | Freq: Once | INTRAMUSCULAR | Status: AC
  Administered 2021-12-28: 5 mg via INTRAMUSCULAR
  Filled 2021-12-28: qty 1

## 2021-12-28 MED ORDER — HALOPERIDOL LACTATE 5 MG/ML IJ SOLN
5.0000 mg | Freq: Once | INTRAMUSCULAR | Status: AC
Start: 2021-12-28 — End: 2021-12-28
  Administered 2021-12-28: 5 mg via INTRAMUSCULAR
  Filled 2021-12-28: qty 1

## 2021-12-28 NOTE — ED Notes (Signed)
Pt received lunch tray 

## 2021-12-28 NOTE — ED Notes (Signed)
Pt wondering around halls and urinating on floor.

## 2021-12-28 NOTE — ED Notes (Signed)
Pt mental status has deteriorated and he is no longer redirectable.

## 2021-12-28 NOTE — ED Notes (Signed)
Pt. not redirectable keeps trying to get out of bed, wants to go home. NT explained to Pt multiple times he can not go home and needs to stay in bed for safety reasons. Will not listen.

## 2021-12-28 NOTE — NC FL2 (Addendum)
[place] MEDICAID FL2 LEVEL OF CARE SCREENING TOOL     IDENTIFICATION  Patient Name: Malik Nicholson Birthdate: [birth year] Sex: male Admission Date (Current Location): 12/27/2021  [facility] and [place] Number:  [employer] and Address:  [place] [address], [place]      Provider Number: [id number]  Attending Physician Name and Address:  Default, Provider, MD  Relative Name and Phone Number:       Current Level of Care: Hospital Recommended Level of Care: [place] Prior Approval Number:    Date Approved/Denied: 12/28/21 PASRR Number: [id number]  Discharge Plan: SNF    Current Diagnoses: Patient Active Problem List   Diagnosis Date Noted   Urinary incontinence 05/25/2020   S/P hip replacement 03/16/2012   Difficulty in walking(719.7) 02/18/2012   Muscle weakness (generalized) 02/18/2012   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/2012   Spinal stenosis 12/10/2011   OA (osteoarthritis) of hip 12/10/2011   DDD (degenerative disc disease), 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/20/2011   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   DDD (degenerative disc disease) 07/30/2011   Osteoarthritis of hip 07/30/2011   ANEMIA, NORMOCYTIC 12/12/2009   DYSPHAGIA UNSPECIFIED 12/12/2009   OSTEOARTHRITIS, HIP, LEFT 05/31/2009   OSTEOARTHRITIS, KNEE, LEFT 05/31/2009   JOINT EFFUSION, LEFT KNEE 05/31/2009   DEGENERATIVE [place] DISEASE, LUMBAR SPINE 05/31/2009    Orientation RESPIRATION BLADDER Height & Weight        Normal Incontinent Weight:  185lbs Height:   6'  BEHAVIORAL SYMPTOMS/MOOD NEUROLOGICAL BOWEL NUTRITION STATUS      Continent Diet  AMBULATORY STATUS COMMUNICATION OF NEEDS Skin   Independent Verbally Normal                       Personal Care Assistance Level of Assistance  Bathing, Feeding, Dressing Bathing Assistance: Independent Feeding assistance: Independent Dressing Assistance: Independent     Functional Limitations Info  Sight,  Hearing, Speech Sight Info: Impaired (Glasses) Hearing Info: Adequate Speech Info: Adequate    SPECIAL CARE FACTORS FREQUENCY                       Contractures Contractures Info: Not present    Additional Factors Info  Allergies, Code Status Code Status Info: Full Code Allergies Info: Lisinopril           Current Medications (12/28/2021):  This is the current hospital active medication list Current Facility-Administered Medications  Medication Dose Route Frequency Provider Last Rate Last Admin   alfuzosin (UROXATRAL) 24 hr tablet 10 mg  10 mg Oral QHS Milton Ferguson, MD   10 mg at 12/27/21 2218   atorvastatin (LIPITOR) tablet 10 mg  10 mg Oral Daily Milton Ferguson, MD   10 mg at 12/28/21 0920   docusate (COLACE) 50 MG/5ML liquid 50 mg  50 mg Oral BID Milton Ferguson, MD   50 mg at 12/28/21 0919   fluticasone (FLONASE) 50 MCG/ACT nasal spray 1 spray  1 spray Each Nare Daily Milton Ferguson, MD       losartan (COZAAR) tablet 50 mg  50 mg Oral Daily Milton Ferguson, MD   50 mg at 12/28/21 0919   memantine (NAMENDA) tablet 5 mg  5 mg Oral Daily Milton Ferguson, MD   5 mg at 12/28/21 0920   Current Outpatient Medications  Medication Sig Dispense Refill   atorvastatin (LIPITOR) 10 MG tablet Take 20 mg by mouth daily.     donepezil (ARICEPT) 10 MG tablet Take  10 mg by mouth daily.     losartan (COZAAR) 50 MG tablet Take 50 mg by mouth daily.     risperiDONE (RISPERDAL) 0.25 MG tablet Take 0.25 mg by mouth 2 (two) times daily as needed.       Discharge Medications: Please see discharge summary for a list of discharge medications.  Relevant Imaging Results:  Relevant Lab Results:   Additional Information SSN: [id number]  Archie Endo, LCSW

## 2021-12-28 NOTE — ED Notes (Signed)
Pt. Received dinner tray

## 2021-12-28 NOTE — ED Notes (Signed)
Pt ambulated to BR with assistance, Pt wobbling, kind of unsteady. Needs hands on assistance.

## 2021-12-28 NOTE — ED Notes (Signed)
Spoke with Niece Sharyn Lull whom states that family is willing to private pay for placement until they can spend down finances and apply for LTC Medicaid. SW updated.

## 2021-12-29 DIAGNOSIS — T59811A Toxic effect of smoke, accidental (unintentional), initial encounter: Secondary | ICD-10-CM | POA: Diagnosis not present

## 2021-12-29 DIAGNOSIS — R9431 Abnormal electrocardiogram [ECG] [EKG]: Secondary | ICD-10-CM | POA: Diagnosis not present

## 2021-12-29 DIAGNOSIS — J705 Respiratory conditions due to smoke inhalation: Secondary | ICD-10-CM | POA: Diagnosis not present

## 2021-12-29 DIAGNOSIS — F0390 Unspecified dementia without behavioral disturbance: Secondary | ICD-10-CM | POA: Diagnosis not present

## 2021-12-29 MED ORDER — HALOPERIDOL LACTATE 5 MG/ML IJ SOLN
5.0000 mg | Freq: Once | INTRAMUSCULAR | Status: AC
  Administered 2021-12-29: 5 mg via INTRAMUSCULAR
  Filled 2021-12-29: qty 1

## 2021-12-29 MED ORDER — LORAZEPAM 2 MG/ML IJ SOLN
2.0000 mg | Freq: Once | INTRAMUSCULAR | Status: AC
  Administered 2021-12-29: 2 mg via INTRAMUSCULAR
  Filled 2021-12-29: qty 1

## 2021-12-29 NOTE — ED Notes (Signed)
Patient's keys are in personal belonging bag.

## 2021-12-29 NOTE — ED Provider Notes (Addendum)
Patient having agitation, but he is adamant that he wants to leave. Has become difficult for sitters to control his behavior.  He had agitation yesterday as well that required some sedation.  I ordered IM Haldol 5 mg.  30 minutes later, patient still agitated and trying to leave. 2 mg IM Ativan ordered.  EKG also ordered which reveals no QT prolongation.  QTc at 430.   Date: 12/29/2021  Rate: 73  Rhythm: normal sinus rhythm  QRS Axis: normal  Intervals: normal  ST/T Wave abnormalities: normal  Conduction Disutrbances: none  Narrative Interpretation: unremarkable    CRITICAL CARE Performed by: Daviyon Widmayer   Total critical care time: 32 minutes  Critical care time was exclusive of separately billable procedures and treating other patients.  Critical care was necessary to treat or prevent imminent or life-threatening deterioration.  Critical care was time spent personally by me on the following activities: development of treatment plan with patient and/or surrogate as well as nursing, discussions with consultants, evaluation of patient's response to treatment, examination of patient, obtaining history from patient or surrogate, ordering and performing treatments and interventions, ordering and review of laboratory studies, ordering and review of radiographic studies, pulse oximetry and re-evaluation of patient's condition.       Varney Biles, MD 12/29/21 2044

## 2021-12-29 NOTE — ED Notes (Signed)
Resting in bed with eyes closed at this time and has thus far this shift.

## 2021-12-29 NOTE — Progress Notes (Signed)
CSW sent patient's clinicals to a few facilities that have a locked unit for review.  Madilyn Fireman, MSW, LCSW Transitions of Care  Clinical Social Worker II [phone]
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
## 2021-12-29 NOTE — ED Notes (Addendum)
Pt becoming increasingly agitated. Adamant on leaving repeatedly stating "I got to go. I have got to go, Im leaving lady Im going home." Both sitters present attempting to redirect with no resolution. Pt also becomes overly agitated around the same time everyday since pt has been in ED per both sitters,  one or both has been sitting every day of pt stay.

## 2021-12-30 MED ORDER — LORAZEPAM 2 MG/ML IJ SOLN
2.0000 mg | Freq: Once | INTRAMUSCULAR | Status: AC
Start: 2021-12-30 — End: 2021-12-30
  Administered 2021-12-30: 2 mg via INTRAMUSCULAR
  Filled 2021-12-30: qty 1

## 2021-12-30 NOTE — Progress Notes (Signed)
CSW spoke with Melissa from [facility] , she reported she will come to the hospital to review pt for possible placement. TOC to follow.  Arlie Solomons.Orlanda Frankum, MSW, [place]  Transitions of Care Clinical Social Worker I Direct Dial: [phone]  Fax: [phone] [email or web address]

## 2021-12-30 NOTE — ED Notes (Signed)
Pt received dinner tray.

## 2021-12-30 NOTE — ED Notes (Signed)
Per Mordecai Maes (DSS), DSS is filing a petition with the court tomorrow AM for modification of guardianship. They have tried contacting pt's wife multiple times with no response. DSS states that they are working with the pt's physician to fill out FL2 form to aid in placement. Will expect to hear a decision tomorrow.

## 2021-12-30 NOTE — ED Notes (Signed)
Pt wandering in hall, difficult to redirect. EDP informed, see MAR

## 2021-12-30 NOTE — ED Notes (Signed)
Breakfast tray given to pt 

## 2021-12-30 NOTE — ED Notes (Signed)
Lunch tray was given to pt.

## 2021-12-31 MED ORDER — RISPERIDONE 0.25 MG PO TABS
0.2500 mg | ORAL_TABLET | Freq: Two times a day (BID) | ORAL | Status: DC | PRN
  Administered 2021-12-31 – 2022-01-01 (×4): 0.25 mg via ORAL
  Filled 2021-12-31 (×4): qty 1

## 2021-12-31 MED ORDER — LORAZEPAM 2 MG/ML IJ SOLN
2.0000 mg | Freq: Once | INTRAMUSCULAR | Status: AC
  Administered 2021-12-31: 2 mg via INTRAMUSCULAR
  Filled 2021-12-31: qty 1

## 2021-12-31 NOTE — NC FL2 (Cosign Needed Addendum)
[place] MEDICAID FL2 LEVEL OF CARE SCREENING TOOL     IDENTIFICATION  Patient Name: Malik Nicholson Birthdate: July 06, 1940 Sex: male Admission Date (Current Location): 12/27/2021  [facility] and [place] Number:  [employer] and Address:  [place] [address] [place]      Provider Number: [id number]  Attending Physician Name and Address:  Default, Provider, MD  Relative Name and Phone Number:  Sharyn Lull (niece) [phone]    Current Level of Care: Hospital Recommended Level of Care: Memory Care Prior Approval Number:    Date Approved/Denied: 12/28/21 PASRR Number: [id number]  Discharge Plan: Other (Comment) (Memory Care)    Current Diagnoses: Patient Active Problem List   Diagnosis Date Noted   Urinary incontinence 05/25/2020   S/P hip replacement 03/16/2012   Difficulty in walking(719.7) 02/18/2012   Muscle weakness (generalized) 02/18/2012   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/2012   Spinal stenosis 12/10/2011   OA (osteoarthritis) of hip 12/10/2011   DDD (degenerative disc disease), 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/20/2011   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   DDD (degenerative disc disease) 07/30/2011   Osteoarthritis of hip 07/30/2011   ANEMIA, NORMOCYTIC 12/12/2009   DYSPHAGIA UNSPECIFIED 12/12/2009   OSTEOARTHRITIS, HIP, LEFT 05/31/2009   OSTEOARTHRITIS, KNEE, LEFT 05/31/2009   JOINT EFFUSION, LEFT KNEE 05/31/2009   DEGENERATIVE DISC DISEASE, LUMBAR SPINE 05/31/2009    Orientation RESPIRATION BLADDER Height & Weight     Self  Normal Continent Weight:   Height:     BEHAVIORAL SYMPTOMS/MOOD NEUROLOGICAL BOWEL NUTRITION STATUS  Wanderer   Continent Diet (Regular)  AMBULATORY STATUS COMMUNICATION OF NEEDS Skin   Independent Verbally Normal                       Personal Care Assistance Level of Assistance  Bathing, Feeding, Dressing Bathing Assistance: Limited assistance Feeding assistance: Independent Dressing Assistance:  Independent     Functional Limitations Info  Sight, Hearing, Speech Sight Info: Adequate Hearing Info: Adequate Speech Info: Adequate    SPECIAL CARE FACTORS FREQUENCY                       Contractures Contractures Info: Not present    Additional Factors Info  Code Status, Allergies Code Status Info: Full Allergies Info: Lisinopril           Current Medications (12/31/2021):  This is the current hospital active medication list Current Facility-Administered Medications  Medication Dose Route Frequency Provider Last Rate Last Admin   alfuzosin (UROXATRAL) 24 hr tablet 10 mg  10 mg Oral QHS Milton Ferguson, MD   10 mg at 12/30/21 2224   atorvastatin (LIPITOR) tablet 10 mg  10 mg Oral Daily Milton Ferguson, MD   10 mg at 12/31/21 1109   docusate (COLACE) 50 MG/5ML liquid 50 mg  50 mg Oral BID Milton Ferguson, MD   50 mg at 12/31/21 1108   fluticasone (FLONASE) 50 MCG/ACT nasal spray 1 spray  1 spray Each Nare Daily Milton Ferguson, MD   1 spray at 12/31/21 1112   losartan (COZAAR) tablet 50 mg  50 mg Oral Daily Milton Ferguson, MD   50 mg at 12/31/21 1108   memantine (NAMENDA) tablet 5 mg  5 mg Oral Daily Milton Ferguson, MD   5 mg at 12/31/21 1108   Current Outpatient Medications  Medication Sig Dispense Refill   atorvastatin (LIPITOR) 10 MG tablet Take 20 mg by mouth daily.     donepezil (ARICEPT)  10 MG tablet Take 10 mg by mouth daily.     losartan (COZAAR) 50 MG tablet Take 50 mg by mouth daily.     risperiDONE (RISPERDAL) 0.25 MG tablet Take 0.25 mg by mouth 2 (two) times daily as needed.       Discharge Medications: Please see discharge summary for a list of discharge medications.  Relevant Imaging Results:  Relevant Lab Results:   Additional Information SSN: [id number]  [place], LCSW

## 2021-12-31 NOTE — ED Notes (Signed)
Pt ambulates to bathroom observation assistance needed.

## 2021-12-31 NOTE — ED Notes (Signed)
Pt continues to try to wander. Staff redirects pt back to his room.

## 2021-12-31 NOTE — ED Notes (Signed)
Pt is anxious and needs redirection to not leave. Pt is telling staff he needs to leave and he needs to find his wife.

## 2021-12-31 NOTE — Progress Notes (Signed)
CSW spoke with Larkin Ina with [facility] to inquire about open beds in their memory care unit , he stated they do have opening. CSW sent referral out.  CSW also sent referral out to [facility] awaiting response. TOC to follow.   Arlie Solomons.Giavanna Kang, MSW, [place]  Transitions of Care Clinical Social Worker I Direct Dial: [phone]  Fax: [phone] [email or web address]

## 2021-12-31 NOTE — ED Notes (Signed)
Pt swallow po meds without difficulty.

## 2021-12-31 NOTE — ED Notes (Signed)
Lunch tray given to pt and able to feed hisself.

## 2021-12-31 NOTE — Progress Notes (Addendum)
CSW spoke with Melissa from [facility] they have declined patients due to psychotropic medications. TOC to follow.   Adden  10:30am CSW spoke with pt's niece Sharyn Lull [phone] and provided an update. CSW inquired if pt's family is looking for a placement as pt was to d/c last week. pt's niece stated she has not looked into placement for pt. CSW informed pt's niece that since the pt is paying private pay it is up to the family to find placement as this was discussed with the family last week. pt's niece stated she will speak with her mother to start looking for memory care placement. TOC to follow.   Arlie Solomons.Chazlyn Cude, MSW, [place]  Transitions of Care Clinical Social Worker I Direct Dial: [phone]  Fax: [phone] [email or web address]

## 2022-01-01 DIAGNOSIS — F0390 Unspecified dementia without behavioral disturbance: Secondary | ICD-10-CM | POA: Diagnosis not present

## 2022-01-01 DIAGNOSIS — J705 Respiratory conditions due to smoke inhalation: Secondary | ICD-10-CM | POA: Diagnosis not present

## 2022-01-01 DIAGNOSIS — T59811A Toxic effect of smoke, accidental (unintentional), initial encounter: Secondary | ICD-10-CM | POA: Diagnosis not present

## 2022-01-01 MED ORDER — LORAZEPAM 1 MG PO TABS
1.0000 mg | ORAL_TABLET | Freq: Once | ORAL | Status: AC
  Administered 2022-01-01: 1 mg via ORAL
  Filled 2022-01-01: qty 1

## 2022-01-01 MED ORDER — LORAZEPAM 2 MG/ML IJ SOLN
2.0000 mg | Freq: Once | INTRAMUSCULAR | Status: AC
  Administered 2022-01-01: 2 mg via INTRAMUSCULAR
  Filled 2022-01-01: qty 1

## 2022-01-01 NOTE — ED Notes (Addendum)
Upon assessment patient is noted to have legs hanging out of bed. This nurse asked patient if he needed to use the restroom. Patient ambulated to side of bed with unsteady gait. Patient placed in wheelchair. Patient noted to be soiled with urine and stool. Patient assisted with bath, new scrubs provided, and new bed linens.

## 2022-01-01 NOTE — ED Notes (Signed)
CSW completed an updated FL2 to show level of care is Memory Care.

## 2022-01-01 NOTE — ED Notes (Signed)
Pt attempts to get out of bed. Pt believes that he has somewhere to go or "go home". Pt is redirectable at this time.

## 2022-01-01 NOTE — ED Notes (Signed)
Patient is wandering around hallway and attempting to "leave." Patient states he appreciates our help and it is now time for him to go. Patient attempting to go into neighboring rooms and is not verbally redirectable. EDP made aware. See new orders.

## 2022-01-01 NOTE — ED Provider Notes (Signed)
4:13 PM Call from TTS, Nancy Marus, requesting medication change. Pt being declined from multiple SNIFs due to resperdal medication without clear diagnosis for need. Requesting eval by pysch for possible medication changes. Ativan or Xanax okay. No antipsychotics without a clear psych dx.   Requests call back when decision is made. Call back number: [phone]  Dr. Campbell Stall,  7/93/9030 0:92 PM    Lianne Cure, DO 33/00/76 1617

## 2022-01-01 NOTE — ED Notes (Signed)
Patient becoming agitated and stating that he needs to go home with his wife to save his marriage. Patient verbally redirected back to bed.

## 2022-01-01 NOTE — ED Notes (Signed)
Pt restless and uncooperative

## 2022-01-01 NOTE — ED Notes (Signed)
Pt resting. Vs deferred until am rounds.

## 2022-01-01 NOTE — ED Notes (Signed)
Patient remains restless and uncooperative. Patient redirected back to bed.

## 2022-01-01 NOTE — ED Notes (Signed)
Pt up and walking around placed back into bed

## 2022-01-01 NOTE — Progress Notes (Signed)
CM spoke with Malik Nicholson with [facility] [phone]) who reports that [facility] has taken guardianship of his person and spouse remains as the guardian of his estate.  This CM spoke with [facility] yesterday and provided referral.  [facility] spoke with niece on yesterday to verify information for private pay and found that patient receives 2500$ per month in [place], as such he would not be a candidate for special assistance medicaid and has no assets to be able to afford the cost of private paying in memory care for extended period of time.    CM updated [employer] of this information and that there are currently no bed offers for patient in any of the SNF facilities with locked units.  Malik Nicholson reports that family has placed an application with [facility] for [facility] for SNF locked unit.   CM outreached to ED provider to discuss placement barriers.  TOC will continue to follow.

## 2022-01-01 NOTE — ED Notes (Signed)
Pt becoming increasingly agitated stating he has to leave. Pt put back in the bed by two NT's
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
## 2022-01-01 NOTE — ED Notes (Addendum)
Pt is agitated and attempting to leave. Cussing at staff at this time. Staff and security at bedside. This RN asked charge to get an order from EDP to calm pt down. Awaiting orders

## 2022-01-01 NOTE — ED Notes (Signed)
Pt wandering out into hall, redirected back into room still refusing to sit down getting down into the floor crawling around looking under the bed

## 2022-01-01 NOTE — ED Notes (Signed)
Pt ambulated to the bathroom with sitter and this RN.

## 2022-01-01 NOTE — ED Notes (Signed)
Pts pants are down around thighs asked pt to pull them up and he became agitated and refused.

## 2022-01-01 NOTE — Progress Notes (Signed)
CSW reached out to [facility] to review pt for memory care placement, they have declined placement.   Arlie Solomons.Babs Dabbs, MSW, [place]  Transitions of Care Clinical Social Worker I Direct Dial: [phone]  Fax: [phone] [email or web address]

## 2022-01-02 DIAGNOSIS — T59811A Toxic effect of smoke, accidental (unintentional), initial encounter: Secondary | ICD-10-CM | POA: Diagnosis not present

## 2022-01-02 DIAGNOSIS — F0390 Unspecified dementia without behavioral disturbance: Secondary | ICD-10-CM | POA: Diagnosis not present

## 2022-01-02 DIAGNOSIS — J705 Respiratory conditions due to smoke inhalation: Secondary | ICD-10-CM | POA: Diagnosis not present

## 2022-01-02 MED ORDER — QUETIAPINE FUMARATE 25 MG PO TABS
25.0000 mg | ORAL_TABLET | Freq: Two times a day (BID) | ORAL | Status: DC
  Administered 2022-01-03 – 2022-01-06 (×7): 25 mg via ORAL
  Filled 2022-01-02 (×7): qty 1

## 2022-01-02 MED ORDER — LORAZEPAM 1 MG PO TABS
1.0000 mg | ORAL_TABLET | Freq: Once | ORAL | Status: AC
  Administered 2022-01-02: 1 mg via ORAL
  Filled 2022-01-02: qty 1

## 2022-01-02 NOTE — ED Notes (Signed)
Pt became agitated wanting to leave.  Hard to redirect patient.  Ativan order obtained from EDP and ativan '1mg'$  PO given.  Will continue to monitor.

## 2022-01-02 NOTE — ED Notes (Signed)
Pt beginning to get agitated, punched garage wall. Trying to get out of bed not redirectable

## 2022-01-02 NOTE — Consult Note (Signed)
  Seroquel 25 mg po BID ordered for agitation, Risperdal 0.25 mg was discontinued. Neurology Consult ordered for follow-up at [facility]. [facility] Tx Team and AP[facility]P made aware of new order and consult.  Garrison [place], NP Psychiatry

## 2022-01-02 NOTE — ED Notes (Signed)
Asked RN if Pt. Could have mitts, because he is pulling pants down, smacking garage door, and pulling on bed rail, RN stated to try to put just 1 mitt on.

## 2022-01-02 NOTE — ED Notes (Signed)
Pt. Undirectable, trying to get out of bed, climbing over side rails, feet placed back in bed pt tried kicking me. Explained that he can not get up because he is unsteady and can fall.

## 2022-01-02 NOTE — ED Notes (Signed)
Pt asleep at this time. Equal rise and fall of the chest noted. Safety sitter remains with patient.

## 2022-01-02 NOTE — ED Notes (Signed)
Telepsych cart placed at bedside per Otila Kluver request at [facility].

## 2022-01-02 NOTE — ED Notes (Signed)
Pt peacefully sleeping. Will obtain VS when pt is awake
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
## 2022-01-03 DIAGNOSIS — T59811A Toxic effect of smoke, accidental (unintentional), initial encounter: Secondary | ICD-10-CM | POA: Diagnosis not present

## 2022-01-03 DIAGNOSIS — F0390 Unspecified dementia without behavioral disturbance: Secondary | ICD-10-CM | POA: Diagnosis not present

## 2022-01-03 DIAGNOSIS — J705 Respiratory conditions due to smoke inhalation: Secondary | ICD-10-CM | POA: Diagnosis not present

## 2022-01-03 MED ORDER — DOCUSATE SODIUM 50 MG/5ML PO LIQD: 50.0000 mg | Freq: Every day | ORAL | Status: DC | PRN
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
## 2022-01-03 NOTE — ED Notes (Addendum)
Patient currently asleep, will hold meds until patient awake d/t patient wandering behavior when awake.

## 2022-01-03 NOTE — Progress Notes (Signed)
TOC noted medication changes.  Reached out to [facility] and [facility], as both facilities have locked memory care units, VM left.  [facility] had declined patient again.

## 2022-01-03 NOTE — ED Notes (Signed)
Pt given lunch tray.

## 2022-01-04 DIAGNOSIS — F0390 Unspecified dementia without behavioral disturbance: Secondary | ICD-10-CM | POA: Diagnosis not present

## 2022-01-04 DIAGNOSIS — T59811A Toxic effect of smoke, accidental (unintentional), initial encounter: Secondary | ICD-10-CM | POA: Diagnosis not present

## 2022-01-04 DIAGNOSIS — J705 Respiratory conditions due to smoke inhalation: Secondary | ICD-10-CM | POA: Diagnosis not present

## 2022-01-04 MED ORDER — LORAZEPAM 1 MG PO TABS
1.0000 mg | ORAL_TABLET | Freq: Four times a day (QID) | ORAL | Status: DC | PRN
  Administered 2022-01-05 – 2022-01-07 (×5): 1 mg via ORAL
  Filled 2022-01-04 (×6): qty 1
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
## 2022-01-04 NOTE — ED Notes (Signed)
Pt sitting in recliner in room drinking coffee talking to sitter. No complaints at this time.

## 2022-01-04 NOTE — ED Notes (Signed)
Pt continues to rest, even rise and fall of chest sitter observing.

## 2022-01-04 NOTE — ED Notes (Signed)
Pt received dinner tray.

## 2022-01-04 NOTE — ED Notes (Signed)
Pt resting in bed with eyes closed. Even rise and fall of chest.

## 2022-01-05 DIAGNOSIS — T59811A Toxic effect of smoke, accidental (unintentional), initial encounter: Secondary | ICD-10-CM | POA: Diagnosis not present

## 2022-01-05 DIAGNOSIS — F0390 Unspecified dementia without behavioral disturbance: Secondary | ICD-10-CM | POA: Diagnosis not present

## 2022-01-05 DIAGNOSIS — J705 Respiratory conditions due to smoke inhalation: Secondary | ICD-10-CM | POA: Diagnosis not present

## 2022-01-05 MED ORDER — ZIPRASIDONE MESYLATE 20 MG IM SOLR
10.0000 mg | Freq: Once | INTRAMUSCULAR | Status: AC
  Administered 2022-01-05: 10 mg via INTRAMUSCULAR
  Filled 2022-01-05: qty 20

## 2022-01-05 MED ORDER — QUETIAPINE FUMARATE 25 MG PO TABS
25.0000 mg | ORAL_TABLET | Freq: Once | ORAL | Status: AC
  Administered 2022-01-05: 25 mg via ORAL
  Filled 2022-01-05: qty 1

## 2022-01-05 MED ORDER — SODIUM CHLORIDE 0.9 % IV BOLUS
1000.0000 mL | Freq: Once | INTRAVENOUS | Status: AC
  Administered 2022-01-05: 1000 mL via INTRAVENOUS

## 2022-01-05 MED ORDER — STERILE WATER FOR INJECTION IJ SOLN
INTRAMUSCULAR | Status: AC
  Administered 2022-01-05: 1.2 mL
  Filled 2022-01-05: qty 10

## 2022-01-05 NOTE — ED Provider Notes (Signed)
  Physical Exam  BP 124/73 (BP Location: Right Arm)   Pulse 76   Temp 99 F (37.2 C) (Oral)   Resp 18   SpO2 100%   Physical Exam  Procedures  Procedures  ED Course / MDM    Medical Decision Making Amount and/or Complexity of Data Reviewed Labs: ordered. Radiology: ordered. ECG/medicine tests: ordered.  Risk Prescription drug management.   Patient to become more agitated.  Wandering the halls.  We will give some IM Geodon.       Davonna Belling, MD 01/05/22 1816

## 2022-01-05 NOTE — ED Notes (Signed)
Pt placed on continuous O2 monitoring with safety sitter at bedside

## 2022-01-05 NOTE — ED Notes (Signed)
Pt confused. Difficult to redirect. Stating he is needing to leave to go home.

## 2022-01-05 NOTE — ED Notes (Signed)
Pt received lunch tray 

## 2022-01-05 NOTE — ED Notes (Signed)
Pt sitting in recliner.

## 2022-01-05 NOTE — ED Notes (Signed)
Pt received dinner tray.
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
## 2022-01-05 NOTE — ED Notes (Addendum)
Pt remains agitated after PRNs. EDP notified. Medication - seroquel ordered

## 2022-01-05 NOTE — ED Notes (Signed)
Pt received breakfast tray 

## 2022-01-05 NOTE — ED Notes (Signed)
MD notified re: pts BP, pt to receive NS bolus

## 2022-01-06 MED ORDER — DIVALPROEX SODIUM 125 MG PO CSDR
250.0000 mg | DELAYED_RELEASE_CAPSULE | Freq: Three times a day (TID) | ORAL | Status: DC
  Administered 2022-01-06 – 2022-01-08 (×4): 250 mg via ORAL
  Filled 2022-01-06 (×4): qty 2

## 2022-01-06 MED ORDER — HALOPERIDOL LACTATE 5 MG/ML IJ SOLN
5.0000 mg | Freq: Once | INTRAMUSCULAR | Status: AC
  Administered 2022-01-06: 5 mg via INTRAVENOUS
  Filled 2022-01-06: qty 1

## 2022-01-06 NOTE — ED Notes (Signed)
Pt remains restless all night pt refusing redirection. Pt taking of clothing and refusing to stay in the bed.  PRNS and one time doses of medications not working.

## 2022-01-06 NOTE — ED Notes (Signed)
Pt taken outside with security and sitter for fresh air

## 2022-01-06 NOTE — Progress Notes (Signed)
CM spoke with DSS guardian who reports DSS is willing to private pay for one month at SNF as patient's medicaid application is expected to be approved once an accepting facility is located.  CM reached out to all SNF facilities with locked memory care units,  [facility] rehab has no open make beds,  [facility] will review patient again [facility] reports would be willing to consider for admission if there are medication adjustments as patient cannot be on an antipsychotic without a qualifying diagnosis and is currently on seroquel which is again state regulations.

## 2022-01-07 ENCOUNTER — Encounter (HOSPITAL_COMMUNITY): Payer: Self-pay

## 2022-01-07 DIAGNOSIS — J705 Respiratory conditions due to smoke inhalation: Secondary | ICD-10-CM | POA: Diagnosis not present

## 2022-01-07 DIAGNOSIS — F0390 Unspecified dementia without behavioral disturbance: Secondary | ICD-10-CM | POA: Diagnosis not present

## 2022-01-07 DIAGNOSIS — T59811A Toxic effect of smoke, accidental (unintentional), initial encounter: Secondary | ICD-10-CM | POA: Diagnosis not present

## 2022-01-07 MED ORDER — HALOPERIDOL LACTATE 5 MG/ML IJ SOLN
5.0000 mg | Freq: Four times a day (QID) | INTRAMUSCULAR | Status: DC | PRN
  Administered 2022-01-07: 5 mg via INTRAVENOUS
  Filled 2022-01-07: qty 1

## 2022-01-07 NOTE — ED Provider Notes (Signed)
Patient has done well overnight.  No complaints.  He has ambulated with nursing staff without difficulty.  Was able to shower with help from nursing staff.  Vitals:   01/06/22 1413 01/06/22 1815  BP: 122/72   Pulse: 90   Resp: 18   Temp: 98.2 F (36.8 C) 98.3 F (36.8 C)  SpO2: 98%       Orpah Greek, MD 01/07/22 [phone]

## 2022-01-07 NOTE — ED Notes (Signed)
Notified nurse that pt is keep getting up and say he leaving

## 2022-01-07 NOTE — Progress Notes (Signed)
CM spoke with Melissa with [facility] who confirms facility is able to accept patient tomorrow for admission.  CM spoke with DSS guardian Carry Nines who will outreach to [facility] to coordinate payment and admission paperwork.   Anticipate discharge to [facility] 7/19.

## 2022-01-07 NOTE — ED Notes (Signed)
Pt is peacefully resting. Body position change noted. Equal and symmetrical respiratory pattern noted

## 2022-01-07 NOTE — ED Notes (Signed)
Pt is peacefully asleep. Will obtain VS when pt awakens

## 2022-01-07 NOTE — ED Notes (Signed)
Walking around in room, trying to come out to hall frequently. Oriented to situation. Books given for entertainment. No complaints of pain.

## 2022-01-07 NOTE — ED Notes (Signed)
Pt taking a shower 

## 2022-01-08 ENCOUNTER — Other Ambulatory Visit: Payer: Self-pay

## 2022-01-08 DIAGNOSIS — T59811A Toxic effect of smoke, accidental (unintentional), initial encounter: Secondary | ICD-10-CM | POA: Diagnosis not present

## 2022-01-08 DIAGNOSIS — M6281 Muscle weakness (generalized): Secondary | ICD-10-CM | POA: Diagnosis not present

## 2022-01-08 DIAGNOSIS — F03918 Unspecified dementia, unspecified severity, with other behavioral disturbance: Secondary | ICD-10-CM | POA: Diagnosis not present

## 2022-01-08 DIAGNOSIS — J705 Respiratory conditions due to smoke inhalation: Secondary | ICD-10-CM | POA: Diagnosis not present

## 2022-01-08 DIAGNOSIS — F0390 Unspecified dementia without behavioral disturbance: Secondary | ICD-10-CM | POA: Diagnosis not present

## 2022-01-08 NOTE — ED Notes (Signed)
Pt took morning meds without difficulty. Calm and cooperative
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
## 2022-01-08 NOTE — ED Notes (Addendum)
CSW spoke to Kelly in admissions at [facility] who states pt can admit to their facility today. Pt will go to room 501B and the number for report is [phone]. CSW provided RN with room and report numbers.    CSW spoke with pts Legal [place] to update on plan for D/C to [facility].   Pt will need EMS transport.  TOC signing off.

## 2022-01-08 NOTE — Patient Outreach (Signed)
[facility]) Care Management  01/08/2022  Malik Nicholson 10/24/1940 [id number]   Patient sent to long term care facility.  Will close.  [name], RN, MSN [facility] Care Management Care Management Coordinator Direct Line [phone] Toll Free: [phone]  Fax: [phone]

## 2022-01-09 DIAGNOSIS — T59811A Toxic effect of smoke, accidental (unintentional), initial encounter: Secondary | ICD-10-CM | POA: Diagnosis not present

## 2022-01-09 DIAGNOSIS — F0390 Unspecified dementia without behavioral disturbance: Secondary | ICD-10-CM | POA: Diagnosis not present

## 2022-01-09 DIAGNOSIS — Z043 Encounter for examination and observation following other accident: Secondary | ICD-10-CM | POA: Diagnosis not present

## 2022-01-09 DIAGNOSIS — F03911 Unspecified dementia, unspecified severity, with agitation: Secondary | ICD-10-CM | POA: Diagnosis not present

## 2022-01-13 DIAGNOSIS — F0390 Unspecified dementia without behavioral disturbance: Secondary | ICD-10-CM | POA: Diagnosis not present

## 2022-01-13 DIAGNOSIS — M6281 Muscle weakness (generalized): Secondary | ICD-10-CM | POA: Diagnosis not present

## 2022-01-13 DIAGNOSIS — Z79899 Other long term (current) drug therapy: Secondary | ICD-10-CM | POA: Diagnosis not present

## 2022-01-13 DIAGNOSIS — F03911 Unspecified dementia, unspecified severity, with agitation: Secondary | ICD-10-CM | POA: Diagnosis not present

## 2022-01-13 DIAGNOSIS — F03918 Unspecified dementia, unspecified severity, with other behavioral disturbance: Secondary | ICD-10-CM | POA: Diagnosis not present

## 2022-01-14 DIAGNOSIS — F0390 Unspecified dementia without behavioral disturbance: Secondary | ICD-10-CM | POA: Diagnosis not present

## 2022-01-14 DIAGNOSIS — R4689 Other symptoms and signs involving appearance and behavior: Secondary | ICD-10-CM | POA: Diagnosis not present

## 2022-01-14 DIAGNOSIS — F03918 Unspecified dementia, unspecified severity, with other behavioral disturbance: Secondary | ICD-10-CM | POA: Diagnosis not present

## 2022-01-14 DIAGNOSIS — F419 Anxiety disorder, unspecified: Secondary | ICD-10-CM | POA: Diagnosis not present

## 2022-01-14 DIAGNOSIS — Z79899 Other long term (current) drug therapy: Secondary | ICD-10-CM | POA: Diagnosis not present

## 2022-01-14 DIAGNOSIS — M6281 Muscle weakness (generalized): Secondary | ICD-10-CM | POA: Diagnosis not present

## 2022-01-15 DIAGNOSIS — F0390 Unspecified dementia without behavioral disturbance: Secondary | ICD-10-CM | POA: Diagnosis not present

## 2022-01-15 DIAGNOSIS — F03911 Unspecified dementia, unspecified severity, with agitation: Secondary | ICD-10-CM | POA: Diagnosis not present

## 2022-01-15 DIAGNOSIS — M6281 Muscle weakness (generalized): Secondary | ICD-10-CM | POA: Diagnosis not present

## 2022-01-15 DIAGNOSIS — F419 Anxiety disorder, unspecified: Secondary | ICD-10-CM | POA: Diagnosis not present

## 2022-01-15 DIAGNOSIS — I10 Essential (primary) hypertension: Secondary | ICD-10-CM | POA: Diagnosis not present

## 2022-01-15 DIAGNOSIS — F03918 Unspecified dementia, unspecified severity, with other behavioral disturbance: Secondary | ICD-10-CM | POA: Diagnosis not present

## 2022-01-16 DIAGNOSIS — E559 Vitamin D deficiency, unspecified: Secondary | ICD-10-CM | POA: Diagnosis not present

## 2022-01-16 DIAGNOSIS — D649 Anemia, unspecified: Secondary | ICD-10-CM | POA: Diagnosis not present

## 2022-01-16 DIAGNOSIS — Z79899 Other long term (current) drug therapy: Secondary | ICD-10-CM | POA: Diagnosis not present

## 2022-01-16 DIAGNOSIS — E785 Hyperlipidemia, unspecified: Secondary | ICD-10-CM | POA: Diagnosis not present

## 2022-01-16 DIAGNOSIS — E039 Hypothyroidism, unspecified: Secondary | ICD-10-CM | POA: Diagnosis not present

## 2022-01-16 DIAGNOSIS — F03918 Unspecified dementia, unspecified severity, with other behavioral disturbance: Secondary | ICD-10-CM | POA: Diagnosis not present

## 2022-01-16 DIAGNOSIS — D519 Vitamin B12 deficiency anemia, unspecified: Secondary | ICD-10-CM | POA: Diagnosis not present

## 2022-01-16 DIAGNOSIS — M6281 Muscle weakness (generalized): Secondary | ICD-10-CM | POA: Diagnosis not present

## 2022-01-17 DIAGNOSIS — F03918 Unspecified dementia, unspecified severity, with other behavioral disturbance: Secondary | ICD-10-CM | POA: Diagnosis not present

## 2022-01-17 DIAGNOSIS — M6281 Muscle weakness (generalized): Secondary | ICD-10-CM | POA: Diagnosis not present

## 2022-01-20 DIAGNOSIS — F03918 Unspecified dementia, unspecified severity, with other behavioral disturbance: Secondary | ICD-10-CM | POA: Diagnosis not present

## 2022-01-20 DIAGNOSIS — I10 Essential (primary) hypertension: Secondary | ICD-10-CM | POA: Diagnosis not present

## 2022-01-20 DIAGNOSIS — E119 Type 2 diabetes mellitus without complications: Secondary | ICD-10-CM | POA: Diagnosis not present

## 2022-01-20 DIAGNOSIS — M6281 Muscle weakness (generalized): Secondary | ICD-10-CM | POA: Diagnosis not present

## 2022-01-20 DIAGNOSIS — E559 Vitamin D deficiency, unspecified: Secondary | ICD-10-CM | POA: Diagnosis not present

## 2022-01-20 DIAGNOSIS — E875 Hyperkalemia: Secondary | ICD-10-CM | POA: Diagnosis not present

## 2022-01-20 DIAGNOSIS — E782 Mixed hyperlipidemia: Secondary | ICD-10-CM | POA: Diagnosis not present

## 2022-01-21 DIAGNOSIS — M6281 Muscle weakness (generalized): Secondary | ICD-10-CM | POA: Diagnosis not present

## 2022-01-21 DIAGNOSIS — F03918 Unspecified dementia, unspecified severity, with other behavioral disturbance: Secondary | ICD-10-CM | POA: Diagnosis not present

## 2022-01-22 DIAGNOSIS — M6281 Muscle weakness (generalized): Secondary | ICD-10-CM | POA: Diagnosis not present

## 2022-01-22 DIAGNOSIS — F03918 Unspecified dementia, unspecified severity, with other behavioral disturbance: Secondary | ICD-10-CM | POA: Diagnosis not present

## 2022-01-23 DIAGNOSIS — F03918 Unspecified dementia, unspecified severity, with other behavioral disturbance: Secondary | ICD-10-CM | POA: Diagnosis not present

## 2022-01-23 DIAGNOSIS — M6281 Muscle weakness (generalized): Secondary | ICD-10-CM | POA: Diagnosis not present

## 2022-01-24 DIAGNOSIS — F03918 Unspecified dementia, unspecified severity, with other behavioral disturbance: Secondary | ICD-10-CM | POA: Diagnosis not present

## 2022-01-24 DIAGNOSIS — M6281 Muscle weakness (generalized): Secondary | ICD-10-CM | POA: Diagnosis not present

## 2022-01-26 DIAGNOSIS — M6281 Muscle weakness (generalized): Secondary | ICD-10-CM | POA: Diagnosis not present

## 2022-01-26 DIAGNOSIS — F03918 Unspecified dementia, unspecified severity, with other behavioral disturbance: Secondary | ICD-10-CM | POA: Diagnosis not present

## 2022-01-27 DIAGNOSIS — M6281 Muscle weakness (generalized): Secondary | ICD-10-CM | POA: Diagnosis not present

## 2022-01-27 DIAGNOSIS — F03918 Unspecified dementia, unspecified severity, with other behavioral disturbance: Secondary | ICD-10-CM | POA: Diagnosis not present

## 2022-01-28 DIAGNOSIS — F419 Anxiety disorder, unspecified: Secondary | ICD-10-CM | POA: Diagnosis not present

## 2022-01-28 DIAGNOSIS — F0390 Unspecified dementia without behavioral disturbance: Secondary | ICD-10-CM | POA: Diagnosis not present

## 2022-01-28 DIAGNOSIS — Z79899 Other long term (current) drug therapy: Secondary | ICD-10-CM | POA: Diagnosis not present

## 2022-01-28 DIAGNOSIS — R4689 Other symptoms and signs involving appearance and behavior: Secondary | ICD-10-CM | POA: Diagnosis not present

## 2022-01-28 DIAGNOSIS — I10 Essential (primary) hypertension: Secondary | ICD-10-CM | POA: Diagnosis not present

## 2022-01-29 DIAGNOSIS — M6281 Muscle weakness (generalized): Secondary | ICD-10-CM | POA: Diagnosis not present

## 2022-01-29 DIAGNOSIS — F03918 Unspecified dementia, unspecified severity, with other behavioral disturbance: Secondary | ICD-10-CM | POA: Diagnosis not present

## 2022-01-30 DIAGNOSIS — M6281 Muscle weakness (generalized): Secondary | ICD-10-CM | POA: Diagnosis not present

## 2022-01-30 DIAGNOSIS — F03918 Unspecified dementia, unspecified severity, with other behavioral disturbance: Secondary | ICD-10-CM | POA: Diagnosis not present

## 2022-01-31 DIAGNOSIS — F03918 Unspecified dementia, unspecified severity, with other behavioral disturbance: Secondary | ICD-10-CM | POA: Diagnosis not present

## 2022-01-31 DIAGNOSIS — M6281 Muscle weakness (generalized): Secondary | ICD-10-CM | POA: Diagnosis not present

## 2022-02-04 DIAGNOSIS — F0390 Unspecified dementia without behavioral disturbance: Secondary | ICD-10-CM | POA: Diagnosis not present

## 2022-02-04 DIAGNOSIS — Z79899 Other long term (current) drug therapy: Secondary | ICD-10-CM | POA: Diagnosis not present

## 2022-02-04 DIAGNOSIS — F03911 Unspecified dementia, unspecified severity, with agitation: Secondary | ICD-10-CM | POA: Diagnosis not present

## 2022-02-05 DIAGNOSIS — F419 Anxiety disorder, unspecified: Secondary | ICD-10-CM | POA: Diagnosis not present

## 2022-02-05 DIAGNOSIS — I10 Essential (primary) hypertension: Secondary | ICD-10-CM | POA: Diagnosis not present

## 2022-02-05 DIAGNOSIS — E559 Vitamin D deficiency, unspecified: Secondary | ICD-10-CM | POA: Diagnosis not present

## 2022-02-05 DIAGNOSIS — F0390 Unspecified dementia without behavioral disturbance: Secondary | ICD-10-CM | POA: Diagnosis not present

## 2022-02-05 DIAGNOSIS — R4689 Other symptoms and signs involving appearance and behavior: Secondary | ICD-10-CM | POA: Diagnosis not present

## 2022-02-10 DIAGNOSIS — Z79899 Other long term (current) drug therapy: Secondary | ICD-10-CM | POA: Diagnosis not present

## 2022-02-10 DIAGNOSIS — E559 Vitamin D deficiency, unspecified: Secondary | ICD-10-CM | POA: Diagnosis not present

## 2022-02-20 DIAGNOSIS — Z79899 Other long term (current) drug therapy: Secondary | ICD-10-CM | POA: Diagnosis not present

## 2022-02-21 ENCOUNTER — Ambulatory Visit: Payer: Self-pay

## 2022-02-27 DIAGNOSIS — F03918 Unspecified dementia, unspecified severity, with other behavioral disturbance: Secondary | ICD-10-CM | POA: Diagnosis not present

## 2022-02-27 DIAGNOSIS — R296 Repeated falls: Secondary | ICD-10-CM | POA: Diagnosis not present

## 2022-02-28 DIAGNOSIS — F03918 Unspecified dementia, unspecified severity, with other behavioral disturbance: Secondary | ICD-10-CM | POA: Diagnosis not present

## 2022-02-28 DIAGNOSIS — R296 Repeated falls: Secondary | ICD-10-CM | POA: Diagnosis not present

## 2022-03-03 DIAGNOSIS — F03918 Unspecified dementia, unspecified severity, with other behavioral disturbance: Secondary | ICD-10-CM | POA: Diagnosis not present

## 2022-03-03 DIAGNOSIS — R296 Repeated falls: Secondary | ICD-10-CM | POA: Diagnosis not present

## 2022-03-05 DIAGNOSIS — I10 Essential (primary) hypertension: Secondary | ICD-10-CM | POA: Diagnosis not present

## 2022-03-05 DIAGNOSIS — F03918 Unspecified dementia, unspecified severity, with other behavioral disturbance: Secondary | ICD-10-CM | POA: Diagnosis not present

## 2022-03-05 DIAGNOSIS — E559 Vitamin D deficiency, unspecified: Secondary | ICD-10-CM | POA: Diagnosis not present

## 2022-03-05 DIAGNOSIS — R296 Repeated falls: Secondary | ICD-10-CM | POA: Diagnosis not present

## 2022-03-05 DIAGNOSIS — F0390 Unspecified dementia without behavioral disturbance: Secondary | ICD-10-CM | POA: Diagnosis not present

## 2022-03-06 DIAGNOSIS — F03918 Unspecified dementia, unspecified severity, with other behavioral disturbance: Secondary | ICD-10-CM | POA: Diagnosis not present

## 2022-03-06 DIAGNOSIS — R296 Repeated falls: Secondary | ICD-10-CM | POA: Diagnosis not present

## 2022-03-07 DIAGNOSIS — R296 Repeated falls: Secondary | ICD-10-CM | POA: Diagnosis not present

## 2022-03-07 DIAGNOSIS — F03918 Unspecified dementia, unspecified severity, with other behavioral disturbance: Secondary | ICD-10-CM | POA: Diagnosis not present

## 2022-03-09 DIAGNOSIS — R296 Repeated falls: Secondary | ICD-10-CM | POA: Diagnosis not present

## 2022-03-09 DIAGNOSIS — F03918 Unspecified dementia, unspecified severity, with other behavioral disturbance: Secondary | ICD-10-CM | POA: Diagnosis not present

## 2022-03-10 DIAGNOSIS — R296 Repeated falls: Secondary | ICD-10-CM | POA: Diagnosis not present

## 2022-03-10 DIAGNOSIS — F03918 Unspecified dementia, unspecified severity, with other behavioral disturbance: Secondary | ICD-10-CM | POA: Diagnosis not present

## 2022-03-11 DIAGNOSIS — R296 Repeated falls: Secondary | ICD-10-CM | POA: Diagnosis not present

## 2022-03-11 DIAGNOSIS — F03918 Unspecified dementia, unspecified severity, with other behavioral disturbance: Secondary | ICD-10-CM | POA: Diagnosis not present

## 2022-03-12 DIAGNOSIS — R296 Repeated falls: Secondary | ICD-10-CM | POA: Diagnosis not present

## 2022-03-12 DIAGNOSIS — F03918 Unspecified dementia, unspecified severity, with other behavioral disturbance: Secondary | ICD-10-CM | POA: Diagnosis not present

## 2022-03-13 DIAGNOSIS — R296 Repeated falls: Secondary | ICD-10-CM | POA: Diagnosis not present

## 2022-03-13 DIAGNOSIS — F03918 Unspecified dementia, unspecified severity, with other behavioral disturbance: Secondary | ICD-10-CM | POA: Diagnosis not present

## 2022-03-17 DIAGNOSIS — F03918 Unspecified dementia, unspecified severity, with other behavioral disturbance: Secondary | ICD-10-CM | POA: Diagnosis not present

## 2022-03-17 DIAGNOSIS — R296 Repeated falls: Secondary | ICD-10-CM | POA: Diagnosis not present

## 2022-03-18 DIAGNOSIS — F03918 Unspecified dementia, unspecified severity, with other behavioral disturbance: Secondary | ICD-10-CM | POA: Diagnosis not present

## 2022-03-18 DIAGNOSIS — R296 Repeated falls: Secondary | ICD-10-CM | POA: Diagnosis not present

## 2022-03-19 DIAGNOSIS — F03918 Unspecified dementia, unspecified severity, with other behavioral disturbance: Secondary | ICD-10-CM | POA: Diagnosis not present

## 2022-03-19 DIAGNOSIS — R296 Repeated falls: Secondary | ICD-10-CM | POA: Diagnosis not present

## 2022-03-20 DIAGNOSIS — R296 Repeated falls: Secondary | ICD-10-CM | POA: Diagnosis not present

## 2022-03-20 DIAGNOSIS — F03918 Unspecified dementia, unspecified severity, with other behavioral disturbance: Secondary | ICD-10-CM | POA: Diagnosis not present

## 2022-03-21 DIAGNOSIS — F03918 Unspecified dementia, unspecified severity, with other behavioral disturbance: Secondary | ICD-10-CM | POA: Diagnosis not present

## 2022-03-21 DIAGNOSIS — R296 Repeated falls: Secondary | ICD-10-CM | POA: Diagnosis not present

## 2022-03-24 DIAGNOSIS — R296 Repeated falls: Secondary | ICD-10-CM | POA: Diagnosis not present

## 2022-03-24 DIAGNOSIS — Z23 Encounter for immunization: Secondary | ICD-10-CM | POA: Diagnosis not present

## 2022-03-24 DIAGNOSIS — F03918 Unspecified dementia, unspecified severity, with other behavioral disturbance: Secondary | ICD-10-CM | POA: Diagnosis not present

## 2022-03-25 DIAGNOSIS — Z23 Encounter for immunization: Secondary | ICD-10-CM | POA: Diagnosis not present

## 2022-03-25 DIAGNOSIS — F03918 Unspecified dementia, unspecified severity, with other behavioral disturbance: Secondary | ICD-10-CM | POA: Diagnosis not present

## 2022-03-25 DIAGNOSIS — R296 Repeated falls: Secondary | ICD-10-CM | POA: Diagnosis not present

## 2022-03-26 DIAGNOSIS — F03918 Unspecified dementia, unspecified severity, with other behavioral disturbance: Secondary | ICD-10-CM | POA: Diagnosis not present

## 2022-03-26 DIAGNOSIS — Z23 Encounter for immunization: Secondary | ICD-10-CM | POA: Diagnosis not present

## 2022-03-26 DIAGNOSIS — R296 Repeated falls: Secondary | ICD-10-CM | POA: Diagnosis not present

## 2022-03-31 DIAGNOSIS — I10 Essential (primary) hypertension: Secondary | ICD-10-CM | POA: Diagnosis not present

## 2022-03-31 DIAGNOSIS — F0390 Unspecified dementia without behavioral disturbance: Secondary | ICD-10-CM | POA: Diagnosis not present

## 2022-03-31 DIAGNOSIS — F419 Anxiety disorder, unspecified: Secondary | ICD-10-CM | POA: Diagnosis not present

## 2022-03-31 DIAGNOSIS — F03918 Unspecified dementia, unspecified severity, with other behavioral disturbance: Secondary | ICD-10-CM | POA: Diagnosis not present

## 2022-03-31 DIAGNOSIS — R296 Repeated falls: Secondary | ICD-10-CM | POA: Diagnosis not present

## 2022-03-31 DIAGNOSIS — Z23 Encounter for immunization: Secondary | ICD-10-CM | POA: Diagnosis not present

## 2022-03-31 DIAGNOSIS — Z79899 Other long term (current) drug therapy: Secondary | ICD-10-CM | POA: Diagnosis not present

## 2022-05-23 DIAGNOSIS — F03918 Unspecified dementia, unspecified severity, with other behavioral disturbance: Secondary | ICD-10-CM | POA: Diagnosis not present

## 2022-05-23 DIAGNOSIS — M6281 Muscle weakness (generalized): Secondary | ICD-10-CM | POA: Diagnosis not present

## 2022-05-24 DIAGNOSIS — Z79899 Other long term (current) drug therapy: Secondary | ICD-10-CM | POA: Diagnosis not present

## 2022-05-24 DIAGNOSIS — R600 Localized edema: Secondary | ICD-10-CM | POA: Diagnosis not present

## 2022-05-26 DIAGNOSIS — M6281 Muscle weakness (generalized): Secondary | ICD-10-CM | POA: Diagnosis not present

## 2022-05-26 DIAGNOSIS — F03918 Unspecified dementia, unspecified severity, with other behavioral disturbance: Secondary | ICD-10-CM | POA: Diagnosis not present

## 2022-05-28 DIAGNOSIS — E559 Vitamin D deficiency, unspecified: Secondary | ICD-10-CM | POA: Diagnosis not present

## 2022-05-28 DIAGNOSIS — M6281 Muscle weakness (generalized): Secondary | ICD-10-CM | POA: Diagnosis not present

## 2022-05-28 DIAGNOSIS — R4689 Other symptoms and signs involving appearance and behavior: Secondary | ICD-10-CM | POA: Diagnosis not present

## 2022-05-28 DIAGNOSIS — F0390 Unspecified dementia without behavioral disturbance: Secondary | ICD-10-CM | POA: Diagnosis not present

## 2022-05-28 DIAGNOSIS — I10 Essential (primary) hypertension: Secondary | ICD-10-CM | POA: Diagnosis not present

## 2022-05-28 DIAGNOSIS — F03911 Unspecified dementia, unspecified severity, with agitation: Secondary | ICD-10-CM | POA: Diagnosis not present

## 2022-05-28 DIAGNOSIS — F03918 Unspecified dementia, unspecified severity, with other behavioral disturbance: Secondary | ICD-10-CM | POA: Diagnosis not present

## 2022-05-29 DIAGNOSIS — M6281 Muscle weakness (generalized): Secondary | ICD-10-CM | POA: Diagnosis not present

## 2022-05-29 DIAGNOSIS — F03918 Unspecified dementia, unspecified severity, with other behavioral disturbance: Secondary | ICD-10-CM | POA: Diagnosis not present

## 2022-05-30 DIAGNOSIS — F03918 Unspecified dementia, unspecified severity, with other behavioral disturbance: Secondary | ICD-10-CM | POA: Diagnosis not present

## 2022-05-30 DIAGNOSIS — M6281 Muscle weakness (generalized): Secondary | ICD-10-CM | POA: Diagnosis not present

## 2022-05-31 DIAGNOSIS — F03918 Unspecified dementia, unspecified severity, with other behavioral disturbance: Secondary | ICD-10-CM | POA: Diagnosis not present

## 2022-05-31 DIAGNOSIS — M6281 Muscle weakness (generalized): Secondary | ICD-10-CM | POA: Diagnosis not present

## 2022-06-01 DIAGNOSIS — F03918 Unspecified dementia, unspecified severity, with other behavioral disturbance: Secondary | ICD-10-CM | POA: Diagnosis not present

## 2022-06-01 DIAGNOSIS — M6281 Muscle weakness (generalized): Secondary | ICD-10-CM | POA: Diagnosis not present

## 2022-06-02 DIAGNOSIS — M6281 Muscle weakness (generalized): Secondary | ICD-10-CM | POA: Diagnosis not present

## 2022-06-02 DIAGNOSIS — F03918 Unspecified dementia, unspecified severity, with other behavioral disturbance: Secondary | ICD-10-CM | POA: Diagnosis not present

## 2022-06-03 DIAGNOSIS — M6281 Muscle weakness (generalized): Secondary | ICD-10-CM | POA: Diagnosis not present

## 2022-06-03 DIAGNOSIS — F03918 Unspecified dementia, unspecified severity, with other behavioral disturbance: Secondary | ICD-10-CM | POA: Diagnosis not present

## 2022-06-05 DIAGNOSIS — F03918 Unspecified dementia, unspecified severity, with other behavioral disturbance: Secondary | ICD-10-CM | POA: Diagnosis not present

## 2022-06-05 DIAGNOSIS — M6281 Muscle weakness (generalized): Secondary | ICD-10-CM | POA: Diagnosis not present

## 2022-06-06 DIAGNOSIS — M6281 Muscle weakness (generalized): Secondary | ICD-10-CM | POA: Diagnosis not present

## 2022-06-06 DIAGNOSIS — F03918 Unspecified dementia, unspecified severity, with other behavioral disturbance: Secondary | ICD-10-CM | POA: Diagnosis not present

## 2022-06-09 DIAGNOSIS — F03918 Unspecified dementia, unspecified severity, with other behavioral disturbance: Secondary | ICD-10-CM | POA: Diagnosis not present

## 2022-06-09 DIAGNOSIS — M6281 Muscle weakness (generalized): Secondary | ICD-10-CM | POA: Diagnosis not present

## 2022-06-10 DIAGNOSIS — M6281 Muscle weakness (generalized): Secondary | ICD-10-CM | POA: Diagnosis not present

## 2022-06-10 DIAGNOSIS — F03918 Unspecified dementia, unspecified severity, with other behavioral disturbance: Secondary | ICD-10-CM | POA: Diagnosis not present

## 2022-06-11 DIAGNOSIS — M6281 Muscle weakness (generalized): Secondary | ICD-10-CM | POA: Diagnosis not present

## 2022-06-11 DIAGNOSIS — F03918 Unspecified dementia, unspecified severity, with other behavioral disturbance: Secondary | ICD-10-CM | POA: Diagnosis not present

## 2022-06-26 DIAGNOSIS — Z79899 Other long term (current) drug therapy: Secondary | ICD-10-CM | POA: Diagnosis not present

## 2022-06-26 DIAGNOSIS — R600 Localized edema: Secondary | ICD-10-CM | POA: Diagnosis not present

## 2022-07-11 DIAGNOSIS — Z79899 Other long term (current) drug therapy: Secondary | ICD-10-CM | POA: Diagnosis not present

## 2022-07-11 DIAGNOSIS — D649 Anemia, unspecified: Secondary | ICD-10-CM | POA: Diagnosis not present

## 2022-07-14 DIAGNOSIS — Z79899 Other long term (current) drug therapy: Secondary | ICD-10-CM | POA: Diagnosis not present

## 2022-07-14 DIAGNOSIS — R262 Difficulty in walking, not elsewhere classified: Secondary | ICD-10-CM | POA: Diagnosis not present

## 2022-07-14 DIAGNOSIS — F03918 Unspecified dementia, unspecified severity, with other behavioral disturbance: Secondary | ICD-10-CM | POA: Diagnosis not present

## 2022-07-14 DIAGNOSIS — R600 Localized edema: Secondary | ICD-10-CM | POA: Diagnosis not present

## 2022-07-15 DIAGNOSIS — Z79899 Other long term (current) drug therapy: Secondary | ICD-10-CM | POA: Diagnosis not present

## 2022-07-15 DIAGNOSIS — F419 Anxiety disorder, unspecified: Secondary | ICD-10-CM | POA: Diagnosis not present

## 2022-07-15 DIAGNOSIS — F0390 Unspecified dementia without behavioral disturbance: Secondary | ICD-10-CM | POA: Diagnosis not present

## 2022-07-15 DIAGNOSIS — Z7189 Other specified counseling: Secondary | ICD-10-CM | POA: Diagnosis not present

## 2022-07-15 DIAGNOSIS — I10 Essential (primary) hypertension: Secondary | ICD-10-CM | POA: Diagnosis not present

## 2022-07-15 DIAGNOSIS — K5900 Constipation, unspecified: Secondary | ICD-10-CM | POA: Diagnosis not present

## 2022-07-16 DIAGNOSIS — M79604 Pain in right leg: Secondary | ICD-10-CM | POA: Diagnosis not present

## 2022-07-16 DIAGNOSIS — Z79899 Other long term (current) drug therapy: Secondary | ICD-10-CM | POA: Diagnosis not present

## 2022-07-16 DIAGNOSIS — I82411 Acute embolism and thrombosis of right femoral vein: Secondary | ICD-10-CM | POA: Diagnosis not present

## 2022-07-16 DIAGNOSIS — M79605 Pain in left leg: Secondary | ICD-10-CM | POA: Diagnosis not present

## 2022-07-17 DIAGNOSIS — Z79899 Other long term (current) drug therapy: Secondary | ICD-10-CM | POA: Diagnosis not present

## 2022-07-17 DIAGNOSIS — F0390 Unspecified dementia without behavioral disturbance: Secondary | ICD-10-CM | POA: Diagnosis not present

## 2022-07-17 DIAGNOSIS — I82411 Acute embolism and thrombosis of right femoral vein: Secondary | ICD-10-CM | POA: Diagnosis not present

## 2022-07-24 DIAGNOSIS — Z79899 Other long term (current) drug therapy: Secondary | ICD-10-CM | POA: Diagnosis not present

## 2022-07-24 DIAGNOSIS — D6832 Hemorrhagic disorder due to extrinsic circulating anticoagulants: Secondary | ICD-10-CM | POA: Diagnosis not present

## 2022-07-25 DIAGNOSIS — Z79899 Other long term (current) drug therapy: Secondary | ICD-10-CM | POA: Diagnosis not present

## 2022-07-25 DIAGNOSIS — D6832 Hemorrhagic disorder due to extrinsic circulating anticoagulants: Secondary | ICD-10-CM | POA: Diagnosis not present

## 2022-08-12 DIAGNOSIS — M6281 Muscle weakness (generalized): Secondary | ICD-10-CM | POA: Diagnosis not present

## 2022-08-12 DIAGNOSIS — F03918 Unspecified dementia, unspecified severity, with other behavioral disturbance: Secondary | ICD-10-CM | POA: Diagnosis not present

## 2022-08-13 DIAGNOSIS — F03918 Unspecified dementia, unspecified severity, with other behavioral disturbance: Secondary | ICD-10-CM | POA: Diagnosis not present

## 2022-08-13 DIAGNOSIS — M6281 Muscle weakness (generalized): Secondary | ICD-10-CM | POA: Diagnosis not present

## 2022-08-14 DIAGNOSIS — F03918 Unspecified dementia, unspecified severity, with other behavioral disturbance: Secondary | ICD-10-CM | POA: Diagnosis not present

## 2022-08-14 DIAGNOSIS — M6281 Muscle weakness (generalized): Secondary | ICD-10-CM | POA: Diagnosis not present

## 2022-08-14 DIAGNOSIS — M1000 Idiopathic gout, unspecified site: Secondary | ICD-10-CM | POA: Diagnosis not present

## 2022-08-15 DIAGNOSIS — F03918 Unspecified dementia, unspecified severity, with other behavioral disturbance: Secondary | ICD-10-CM | POA: Diagnosis not present

## 2022-08-15 DIAGNOSIS — M6281 Muscle weakness (generalized): Secondary | ICD-10-CM | POA: Diagnosis not present

## 2022-08-18 DIAGNOSIS — F03918 Unspecified dementia, unspecified severity, with other behavioral disturbance: Secondary | ICD-10-CM | POA: Diagnosis not present

## 2022-08-18 DIAGNOSIS — M6281 Muscle weakness (generalized): Secondary | ICD-10-CM | POA: Diagnosis not present

## 2022-08-20 DIAGNOSIS — F03918 Unspecified dementia, unspecified severity, with other behavioral disturbance: Secondary | ICD-10-CM | POA: Diagnosis not present

## 2022-08-20 DIAGNOSIS — M6281 Muscle weakness (generalized): Secondary | ICD-10-CM | POA: Diagnosis not present

## 2022-08-21 DIAGNOSIS — M6281 Muscle weakness (generalized): Secondary | ICD-10-CM | POA: Diagnosis not present

## 2022-08-21 DIAGNOSIS — F03918 Unspecified dementia, unspecified severity, with other behavioral disturbance: Secondary | ICD-10-CM | POA: Diagnosis not present

## 2022-08-22 DIAGNOSIS — M6281 Muscle weakness (generalized): Secondary | ICD-10-CM | POA: Diagnosis not present

## 2022-08-22 DIAGNOSIS — F03918 Unspecified dementia, unspecified severity, with other behavioral disturbance: Secondary | ICD-10-CM | POA: Diagnosis not present

## 2022-08-25 DIAGNOSIS — M6281 Muscle weakness (generalized): Secondary | ICD-10-CM | POA: Diagnosis not present

## 2022-08-25 DIAGNOSIS — F03918 Unspecified dementia, unspecified severity, with other behavioral disturbance: Secondary | ICD-10-CM | POA: Diagnosis not present

## 2022-08-26 DIAGNOSIS — M6281 Muscle weakness (generalized): Secondary | ICD-10-CM | POA: Diagnosis not present

## 2022-08-26 DIAGNOSIS — F03918 Unspecified dementia, unspecified severity, with other behavioral disturbance: Secondary | ICD-10-CM | POA: Diagnosis not present

## 2022-09-10 DIAGNOSIS — I10 Essential (primary) hypertension: Secondary | ICD-10-CM | POA: Diagnosis not present

## 2022-09-10 DIAGNOSIS — I82411 Acute embolism and thrombosis of right femoral vein: Secondary | ICD-10-CM | POA: Diagnosis not present

## 2022-09-10 DIAGNOSIS — F419 Anxiety disorder, unspecified: Secondary | ICD-10-CM | POA: Diagnosis not present

## 2022-09-10 DIAGNOSIS — D6832 Hemorrhagic disorder due to extrinsic circulating anticoagulants: Secondary | ICD-10-CM | POA: Diagnosis not present

## 2022-09-10 DIAGNOSIS — E559 Vitamin D deficiency, unspecified: Secondary | ICD-10-CM | POA: Diagnosis not present

## 2022-09-10 DIAGNOSIS — F0390 Unspecified dementia without behavioral disturbance: Secondary | ICD-10-CM | POA: Diagnosis not present

## 2022-10-13 DIAGNOSIS — Z79899 Other long term (current) drug therapy: Secondary | ICD-10-CM | POA: Diagnosis not present

## 2022-10-13 DIAGNOSIS — R1319 Other dysphagia: Secondary | ICD-10-CM | POA: Diagnosis not present

## 2022-10-13 DIAGNOSIS — F03918 Unspecified dementia, unspecified severity, with other behavioral disturbance: Secondary | ICD-10-CM | POA: Diagnosis not present

## 2022-10-13 DIAGNOSIS — I82411 Acute embolism and thrombosis of right femoral vein: Secondary | ICD-10-CM | POA: Diagnosis not present

## 2022-10-13 DIAGNOSIS — I10 Essential (primary) hypertension: Secondary | ICD-10-CM | POA: Diagnosis not present

## 2022-10-13 DIAGNOSIS — F0390 Unspecified dementia without behavioral disturbance: Secondary | ICD-10-CM | POA: Diagnosis not present

## 2022-10-14 DIAGNOSIS — R1319 Other dysphagia: Secondary | ICD-10-CM | POA: Diagnosis not present

## 2022-10-14 DIAGNOSIS — F03918 Unspecified dementia, unspecified severity, with other behavioral disturbance: Secondary | ICD-10-CM | POA: Diagnosis not present

## 2022-10-15 DIAGNOSIS — F03918 Unspecified dementia, unspecified severity, with other behavioral disturbance: Secondary | ICD-10-CM | POA: Diagnosis not present

## 2022-10-15 DIAGNOSIS — R1319 Other dysphagia: Secondary | ICD-10-CM | POA: Diagnosis not present

## 2022-10-16 DIAGNOSIS — R1319 Other dysphagia: Secondary | ICD-10-CM | POA: Diagnosis not present

## 2022-10-16 DIAGNOSIS — F03918 Unspecified dementia, unspecified severity, with other behavioral disturbance: Secondary | ICD-10-CM | POA: Diagnosis not present

## 2022-10-17 DIAGNOSIS — R1319 Other dysphagia: Secondary | ICD-10-CM | POA: Diagnosis not present

## 2022-10-17 DIAGNOSIS — F03918 Unspecified dementia, unspecified severity, with other behavioral disturbance: Secondary | ICD-10-CM | POA: Diagnosis not present

## 2022-10-20 DIAGNOSIS — R1319 Other dysphagia: Secondary | ICD-10-CM | POA: Diagnosis not present

## 2022-10-20 DIAGNOSIS — F03918 Unspecified dementia, unspecified severity, with other behavioral disturbance: Secondary | ICD-10-CM | POA: Diagnosis not present

## 2022-10-21 DIAGNOSIS — F03918 Unspecified dementia, unspecified severity, with other behavioral disturbance: Secondary | ICD-10-CM | POA: Diagnosis not present

## 2022-10-21 DIAGNOSIS — R1319 Other dysphagia: Secondary | ICD-10-CM | POA: Diagnosis not present

## 2022-10-22 DIAGNOSIS — R1319 Other dysphagia: Secondary | ICD-10-CM | POA: Diagnosis not present

## 2022-10-22 DIAGNOSIS — F03918 Unspecified dementia, unspecified severity, with other behavioral disturbance: Secondary | ICD-10-CM | POA: Diagnosis not present

## 2022-10-23 DIAGNOSIS — R1319 Other dysphagia: Secondary | ICD-10-CM | POA: Diagnosis not present

## 2022-10-23 DIAGNOSIS — F03918 Unspecified dementia, unspecified severity, with other behavioral disturbance: Secondary | ICD-10-CM | POA: Diagnosis not present

## 2022-10-24 DIAGNOSIS — R1319 Other dysphagia: Secondary | ICD-10-CM | POA: Diagnosis not present

## 2022-10-24 DIAGNOSIS — F03918 Unspecified dementia, unspecified severity, with other behavioral disturbance: Secondary | ICD-10-CM | POA: Diagnosis not present

## 2022-10-27 DIAGNOSIS — F03918 Unspecified dementia, unspecified severity, with other behavioral disturbance: Secondary | ICD-10-CM | POA: Diagnosis not present

## 2022-10-27 DIAGNOSIS — R1319 Other dysphagia: Secondary | ICD-10-CM | POA: Diagnosis not present

## 2022-10-28 DIAGNOSIS — F03918 Unspecified dementia, unspecified severity, with other behavioral disturbance: Secondary | ICD-10-CM | POA: Diagnosis not present

## 2022-10-28 DIAGNOSIS — R1319 Other dysphagia: Secondary | ICD-10-CM | POA: Diagnosis not present

## 2022-10-29 DIAGNOSIS — F03918 Unspecified dementia, unspecified severity, with other behavioral disturbance: Secondary | ICD-10-CM | POA: Diagnosis not present

## 2022-10-29 DIAGNOSIS — R1319 Other dysphagia: Secondary | ICD-10-CM | POA: Diagnosis not present

## 2022-10-30 DIAGNOSIS — F03918 Unspecified dementia, unspecified severity, with other behavioral disturbance: Secondary | ICD-10-CM | POA: Diagnosis not present

## 2022-10-30 DIAGNOSIS — R1319 Other dysphagia: Secondary | ICD-10-CM | POA: Diagnosis not present

## 2022-11-03 DIAGNOSIS — D649 Anemia, unspecified: Secondary | ICD-10-CM | POA: Diagnosis not present

## 2022-11-03 DIAGNOSIS — Z79899 Other long term (current) drug therapy: Secondary | ICD-10-CM | POA: Diagnosis not present

## 2022-11-07 DIAGNOSIS — F03918 Unspecified dementia, unspecified severity, with other behavioral disturbance: Secondary | ICD-10-CM | POA: Diagnosis not present

## 2022-11-07 DIAGNOSIS — R1319 Other dysphagia: Secondary | ICD-10-CM | POA: Diagnosis not present

## 2022-11-13 DIAGNOSIS — F0390 Unspecified dementia without behavioral disturbance: Secondary | ICD-10-CM | POA: Diagnosis not present

## 2022-11-13 DIAGNOSIS — Z79899 Other long term (current) drug therapy: Secondary | ICD-10-CM | POA: Diagnosis not present

## 2022-11-13 DIAGNOSIS — I10 Essential (primary) hypertension: Secondary | ICD-10-CM | POA: Diagnosis not present

## 2022-11-13 DIAGNOSIS — F419 Anxiety disorder, unspecified: Secondary | ICD-10-CM | POA: Diagnosis not present

## 2022-11-25 DIAGNOSIS — R451 Restlessness and agitation: Secondary | ICD-10-CM | POA: Diagnosis not present

## 2022-11-26 DIAGNOSIS — F3132 Bipolar disorder, current episode depressed, moderate: Secondary | ICD-10-CM | POA: Diagnosis not present

## 2022-11-26 DIAGNOSIS — F02C2 Dementia in other diseases classified elsewhere, severe, with psychotic disturbance: Secondary | ICD-10-CM | POA: Diagnosis not present

## 2022-12-10 DIAGNOSIS — E559 Vitamin D deficiency, unspecified: Secondary | ICD-10-CM | POA: Diagnosis not present

## 2022-12-10 DIAGNOSIS — D6832 Hemorrhagic disorder due to extrinsic circulating anticoagulants: Secondary | ICD-10-CM | POA: Diagnosis not present

## 2022-12-10 DIAGNOSIS — I10 Essential (primary) hypertension: Secondary | ICD-10-CM | POA: Diagnosis not present

## 2022-12-10 DIAGNOSIS — F419 Anxiety disorder, unspecified: Secondary | ICD-10-CM | POA: Diagnosis not present

## 2022-12-10 DIAGNOSIS — I82411 Acute embolism and thrombosis of right femoral vein: Secondary | ICD-10-CM | POA: Diagnosis not present

## 2022-12-10 DIAGNOSIS — F0390 Unspecified dementia without behavioral disturbance: Secondary | ICD-10-CM | POA: Diagnosis not present

## 2022-12-22 DIAGNOSIS — L89152 Pressure ulcer of sacral region, stage 2: Secondary | ICD-10-CM | POA: Diagnosis not present

## 2022-12-30 DIAGNOSIS — Z79899 Other long term (current) drug therapy: Secondary | ICD-10-CM | POA: Diagnosis not present

## 2022-12-30 DIAGNOSIS — I82411 Acute embolism and thrombosis of right femoral vein: Secondary | ICD-10-CM | POA: Diagnosis not present

## 2023-01-01 DIAGNOSIS — F02C2 Dementia in other diseases classified elsewhere, severe, with psychotic disturbance: Secondary | ICD-10-CM | POA: Diagnosis not present

## 2023-01-01 DIAGNOSIS — F3132 Bipolar disorder, current episode depressed, moderate: Secondary | ICD-10-CM | POA: Diagnosis not present

## 2023-01-05 DIAGNOSIS — R1312 Dysphagia, oropharyngeal phase: Secondary | ICD-10-CM | POA: Diagnosis not present

## 2023-01-05 DIAGNOSIS — F03918 Unspecified dementia, unspecified severity, with other behavioral disturbance: Secondary | ICD-10-CM | POA: Diagnosis not present

## 2023-01-06 DIAGNOSIS — F03918 Unspecified dementia, unspecified severity, with other behavioral disturbance: Secondary | ICD-10-CM | POA: Diagnosis not present

## 2023-01-06 DIAGNOSIS — R1312 Dysphagia, oropharyngeal phase: Secondary | ICD-10-CM | POA: Diagnosis not present

## 2023-01-07 DIAGNOSIS — F03918 Unspecified dementia, unspecified severity, with other behavioral disturbance: Secondary | ICD-10-CM | POA: Diagnosis not present

## 2023-01-07 DIAGNOSIS — R1312 Dysphagia, oropharyngeal phase: Secondary | ICD-10-CM | POA: Diagnosis not present

## 2023-01-08 DIAGNOSIS — F03918 Unspecified dementia, unspecified severity, with other behavioral disturbance: Secondary | ICD-10-CM | POA: Diagnosis not present

## 2023-01-08 DIAGNOSIS — R1312 Dysphagia, oropharyngeal phase: Secondary | ICD-10-CM | POA: Diagnosis not present

## 2023-01-08 DIAGNOSIS — R0989 Other specified symptoms and signs involving the circulatory and respiratory systems: Secondary | ICD-10-CM | POA: Diagnosis not present

## 2023-01-08 DIAGNOSIS — Z79899 Other long term (current) drug therapy: Secondary | ICD-10-CM | POA: Diagnosis not present

## 2023-01-08 DIAGNOSIS — R058 Other specified cough: Secondary | ICD-10-CM | POA: Diagnosis not present

## 2023-01-09 DIAGNOSIS — F03918 Unspecified dementia, unspecified severity, with other behavioral disturbance: Secondary | ICD-10-CM | POA: Diagnosis not present

## 2023-01-09 DIAGNOSIS — R1312 Dysphagia, oropharyngeal phase: Secondary | ICD-10-CM | POA: Diagnosis not present

## 2023-01-12 DIAGNOSIS — R1312 Dysphagia, oropharyngeal phase: Secondary | ICD-10-CM | POA: Diagnosis not present

## 2023-01-12 DIAGNOSIS — F03918 Unspecified dementia, unspecified severity, with other behavioral disturbance: Secondary | ICD-10-CM | POA: Diagnosis not present

## 2023-01-13 DIAGNOSIS — R1312 Dysphagia, oropharyngeal phase: Secondary | ICD-10-CM | POA: Diagnosis not present

## 2023-01-13 DIAGNOSIS — F03918 Unspecified dementia, unspecified severity, with other behavioral disturbance: Secondary | ICD-10-CM | POA: Diagnosis not present

## 2023-01-15 DIAGNOSIS — R1312 Dysphagia, oropharyngeal phase: Secondary | ICD-10-CM | POA: Diagnosis not present

## 2023-01-15 DIAGNOSIS — F03918 Unspecified dementia, unspecified severity, with other behavioral disturbance: Secondary | ICD-10-CM | POA: Diagnosis not present

## 2023-01-16 DIAGNOSIS — R1312 Dysphagia, oropharyngeal phase: Secondary | ICD-10-CM | POA: Diagnosis not present

## 2023-01-16 DIAGNOSIS — F03918 Unspecified dementia, unspecified severity, with other behavioral disturbance: Secondary | ICD-10-CM | POA: Diagnosis not present

## 2023-01-17 DIAGNOSIS — F03918 Unspecified dementia, unspecified severity, with other behavioral disturbance: Secondary | ICD-10-CM | POA: Diagnosis not present

## 2023-01-17 DIAGNOSIS — R1312 Dysphagia, oropharyngeal phase: Secondary | ICD-10-CM | POA: Diagnosis not present

## 2023-01-26 DIAGNOSIS — M79604 Pain in right leg: Secondary | ICD-10-CM | POA: Diagnosis not present

## 2023-01-26 DIAGNOSIS — M79605 Pain in left leg: Secondary | ICD-10-CM | POA: Diagnosis not present

## 2023-01-26 DIAGNOSIS — F02C2 Dementia in other diseases classified elsewhere, severe, with psychotic disturbance: Secondary | ICD-10-CM | POA: Diagnosis not present

## 2023-01-26 DIAGNOSIS — F3132 Bipolar disorder, current episode depressed, moderate: Secondary | ICD-10-CM | POA: Diagnosis not present

## 2023-01-28 DIAGNOSIS — R52 Pain, unspecified: Secondary | ICD-10-CM | POA: Diagnosis not present

## 2023-01-28 DIAGNOSIS — L089 Local infection of the skin and subcutaneous tissue, unspecified: Secondary | ICD-10-CM | POA: Diagnosis not present

## 2023-01-28 DIAGNOSIS — Z79899 Other long term (current) drug therapy: Secondary | ICD-10-CM | POA: Diagnosis not present

## 2023-01-28 DIAGNOSIS — T148XXA Other injury of unspecified body region, initial encounter: Secondary | ICD-10-CM | POA: Diagnosis not present

## 2023-01-28 DIAGNOSIS — I82411 Acute embolism and thrombosis of right femoral vein: Secondary | ICD-10-CM | POA: Diagnosis not present

## 2023-01-29 DIAGNOSIS — R0989 Other specified symptoms and signs involving the circulatory and respiratory systems: Secondary | ICD-10-CM | POA: Diagnosis not present

## 2023-02-02 DIAGNOSIS — T148XXA Other injury of unspecified body region, initial encounter: Secondary | ICD-10-CM | POA: Diagnosis not present

## 2023-02-02 DIAGNOSIS — Z79899 Other long term (current) drug therapy: Secondary | ICD-10-CM | POA: Diagnosis not present

## 2023-02-02 DIAGNOSIS — L089 Local infection of the skin and subcutaneous tissue, unspecified: Secondary | ICD-10-CM | POA: Diagnosis not present

## 2023-02-13 DIAGNOSIS — L89153 Pressure ulcer of sacral region, stage 3: Secondary | ICD-10-CM | POA: Diagnosis not present

## 2023-02-17 DIAGNOSIS — Z79899 Other long term (current) drug therapy: Secondary | ICD-10-CM | POA: Diagnosis not present

## 2023-02-17 DIAGNOSIS — R770 Abnormality of albumin: Secondary | ICD-10-CM | POA: Diagnosis not present

## 2023-02-17 DIAGNOSIS — F3170 Bipolar disorder, currently in remission, most recent episode unspecified: Secondary | ICD-10-CM | POA: Diagnosis not present

## 2023-02-17 DIAGNOSIS — F0390 Unspecified dementia without behavioral disturbance: Secondary | ICD-10-CM | POA: Diagnosis not present

## 2023-02-17 DIAGNOSIS — I10 Essential (primary) hypertension: Secondary | ICD-10-CM | POA: Diagnosis not present

## 2023-02-19 DIAGNOSIS — Z79899 Other long term (current) drug therapy: Secondary | ICD-10-CM | POA: Diagnosis not present

## 2023-02-27 DIAGNOSIS — F02C2 Dementia in other diseases classified elsewhere, severe, with psychotic disturbance: Secondary | ICD-10-CM | POA: Diagnosis not present

## 2023-02-27 DIAGNOSIS — F3132 Bipolar disorder, current episode depressed, moderate: Secondary | ICD-10-CM | POA: Diagnosis not present

## 2023-03-05 DIAGNOSIS — L89152 Pressure ulcer of sacral region, stage 2: Secondary | ICD-10-CM | POA: Diagnosis not present

## 2023-03-05 DIAGNOSIS — M25431 Effusion, right wrist: Secondary | ICD-10-CM | POA: Diagnosis not present

## 2023-03-05 DIAGNOSIS — R3915 Urgency of urination: Secondary | ICD-10-CM | POA: Diagnosis not present

## 2023-03-05 DIAGNOSIS — M25531 Pain in right wrist: Secondary | ICD-10-CM | POA: Diagnosis not present

## 2023-03-05 DIAGNOSIS — Z79899 Other long term (current) drug therapy: Secondary | ICD-10-CM | POA: Diagnosis not present

## 2023-03-05 DIAGNOSIS — R52 Pain, unspecified: Secondary | ICD-10-CM | POA: Diagnosis not present

## 2023-03-05 DIAGNOSIS — M79641 Pain in right hand: Secondary | ICD-10-CM | POA: Diagnosis not present

## 2023-03-06 DIAGNOSIS — N200 Calculus of kidney: Secondary | ICD-10-CM | POA: Diagnosis not present

## 2023-03-06 DIAGNOSIS — M79601 Pain in right arm: Secondary | ICD-10-CM | POA: Diagnosis not present

## 2023-03-09 DIAGNOSIS — Z79899 Other long term (current) drug therapy: Secondary | ICD-10-CM | POA: Diagnosis not present

## 2023-03-09 DIAGNOSIS — R3989 Other symptoms and signs involving the genitourinary system: Secondary | ICD-10-CM | POA: Diagnosis not present

## 2023-03-09 DIAGNOSIS — Z9189 Other specified personal risk factors, not elsewhere classified: Secondary | ICD-10-CM | POA: Diagnosis not present

## 2023-03-09 DIAGNOSIS — R300 Dysuria: Secondary | ICD-10-CM | POA: Diagnosis not present

## 2023-03-12 DIAGNOSIS — F03918 Unspecified dementia, unspecified severity, with other behavioral disturbance: Secondary | ICD-10-CM | POA: Diagnosis not present

## 2023-03-12 DIAGNOSIS — R1312 Dysphagia, oropharyngeal phase: Secondary | ICD-10-CM | POA: Diagnosis not present

## 2023-03-13 DIAGNOSIS — R1312 Dysphagia, oropharyngeal phase: Secondary | ICD-10-CM | POA: Diagnosis not present

## 2023-03-13 DIAGNOSIS — F03918 Unspecified dementia, unspecified severity, with other behavioral disturbance: Secondary | ICD-10-CM | POA: Diagnosis not present

## 2023-03-16 DIAGNOSIS — R062 Wheezing: Secondary | ICD-10-CM | POA: Diagnosis not present

## 2023-03-16 DIAGNOSIS — R1312 Dysphagia, oropharyngeal phase: Secondary | ICD-10-CM | POA: Diagnosis not present

## 2023-03-16 DIAGNOSIS — J189 Pneumonia, unspecified organism: Secondary | ICD-10-CM | POA: Diagnosis not present

## 2023-03-16 DIAGNOSIS — F03918 Unspecified dementia, unspecified severity, with other behavioral disturbance: Secondary | ICD-10-CM | POA: Diagnosis not present

## 2023-03-16 DIAGNOSIS — Z79899 Other long term (current) drug therapy: Secondary | ICD-10-CM | POA: Diagnosis not present

## 2023-03-16 DIAGNOSIS — R0602 Shortness of breath: Secondary | ICD-10-CM | POA: Diagnosis not present

## 2023-03-16 DIAGNOSIS — R0989 Other specified symptoms and signs involving the circulatory and respiratory systems: Secondary | ICD-10-CM | POA: Diagnosis not present

## 2023-03-17 DIAGNOSIS — Z79899 Other long term (current) drug therapy: Secondary | ICD-10-CM | POA: Diagnosis not present

## 2023-03-17 DIAGNOSIS — R1312 Dysphagia, oropharyngeal phase: Secondary | ICD-10-CM | POA: Diagnosis not present

## 2023-03-17 DIAGNOSIS — F03918 Unspecified dementia, unspecified severity, with other behavioral disturbance: Secondary | ICD-10-CM | POA: Diagnosis not present

## 2023-03-17 DIAGNOSIS — E441 Mild protein-calorie malnutrition: Secondary | ICD-10-CM | POA: Diagnosis not present

## 2023-03-17 DIAGNOSIS — R627 Adult failure to thrive: Secondary | ICD-10-CM | POA: Diagnosis not present

## 2023-03-17 DIAGNOSIS — Z9189 Other specified personal risk factors, not elsewhere classified: Secondary | ICD-10-CM | POA: Diagnosis not present

## 2023-03-18 DIAGNOSIS — F03918 Unspecified dementia, unspecified severity, with other behavioral disturbance: Secondary | ICD-10-CM | POA: Diagnosis not present

## 2023-03-18 DIAGNOSIS — R1312 Dysphagia, oropharyngeal phase: Secondary | ICD-10-CM | POA: Diagnosis not present

## 2023-03-19 ENCOUNTER — Inpatient Hospital Stay (HOSPITAL_COMMUNITY)
Admission: EM | Admit: 2023-03-19 | Discharge: 2023-03-24 | DRG: 871 | Disposition: E | Payer: Medicare HMO | Source: Skilled Nursing Facility | Attending: Pulmonary Disease | Admitting: Pulmonary Disease

## 2023-03-19 ENCOUNTER — Other Ambulatory Visit: Payer: Self-pay

## 2023-03-19 ENCOUNTER — Encounter (HOSPITAL_COMMUNITY): Payer: Self-pay

## 2023-03-19 ENCOUNTER — Emergency Department (HOSPITAL_COMMUNITY): Payer: Medicare HMO

## 2023-03-19 DIAGNOSIS — J9621 Acute and chronic respiratory failure with hypoxia: Secondary | ICD-10-CM | POA: Diagnosis present

## 2023-03-19 DIAGNOSIS — R0902 Hypoxemia: Secondary | ICD-10-CM | POA: Diagnosis not present

## 2023-03-19 DIAGNOSIS — E7800 Pure hypercholesterolemia, unspecified: Secondary | ICD-10-CM | POA: Diagnosis present

## 2023-03-19 DIAGNOSIS — Z471 Aftercare following joint replacement surgery: Secondary | ICD-10-CM | POA: Diagnosis not present

## 2023-03-19 DIAGNOSIS — A419 Sepsis, unspecified organism: Secondary | ICD-10-CM | POA: Diagnosis not present

## 2023-03-19 DIAGNOSIS — E860 Dehydration: Secondary | ICD-10-CM | POA: Diagnosis present

## 2023-03-19 DIAGNOSIS — J189 Pneumonia, unspecified organism: Secondary | ICD-10-CM | POA: Diagnosis present

## 2023-03-19 DIAGNOSIS — I959 Hypotension, unspecified: Secondary | ICD-10-CM | POA: Diagnosis not present

## 2023-03-19 DIAGNOSIS — Z86718 Personal history of other venous thrombosis and embolism: Secondary | ICD-10-CM

## 2023-03-19 DIAGNOSIS — I469 Cardiac arrest, cause unspecified: Secondary | ICD-10-CM | POA: Diagnosis not present

## 2023-03-19 DIAGNOSIS — I739 Peripheral vascular disease, unspecified: Secondary | ICD-10-CM | POA: Diagnosis present

## 2023-03-19 DIAGNOSIS — Z9981 Dependence on supplemental oxygen: Secondary | ICD-10-CM | POA: Diagnosis not present

## 2023-03-19 DIAGNOSIS — R627 Adult failure to thrive: Secondary | ICD-10-CM | POA: Diagnosis not present

## 2023-03-19 DIAGNOSIS — Z66 Do not resuscitate: Secondary | ICD-10-CM | POA: Diagnosis present

## 2023-03-19 DIAGNOSIS — F03918 Unspecified dementia, unspecified severity, with other behavioral disturbance: Secondary | ICD-10-CM | POA: Diagnosis not present

## 2023-03-19 DIAGNOSIS — I10 Essential (primary) hypertension: Secondary | ICD-10-CM | POA: Diagnosis present

## 2023-03-19 DIAGNOSIS — R1312 Dysphagia, oropharyngeal phase: Secondary | ICD-10-CM | POA: Diagnosis not present

## 2023-03-19 DIAGNOSIS — F319 Bipolar disorder, unspecified: Secondary | ICD-10-CM | POA: Diagnosis present

## 2023-03-19 DIAGNOSIS — N179 Acute kidney failure, unspecified: Secondary | ICD-10-CM | POA: Diagnosis present

## 2023-03-19 DIAGNOSIS — M1990 Unspecified osteoarthritis, unspecified site: Secondary | ICD-10-CM | POA: Diagnosis present

## 2023-03-19 DIAGNOSIS — F03911 Unspecified dementia, unspecified severity, with agitation: Secondary | ICD-10-CM | POA: Diagnosis present

## 2023-03-19 DIAGNOSIS — R000 Tachycardia, unspecified: Secondary | ICD-10-CM | POA: Diagnosis not present

## 2023-03-19 DIAGNOSIS — R0602 Shortness of breath: Secondary | ICD-10-CM | POA: Diagnosis not present

## 2023-03-19 DIAGNOSIS — R6521 Severe sepsis with septic shock: Secondary | ICD-10-CM | POA: Diagnosis present

## 2023-03-19 DIAGNOSIS — Z79899 Other long term (current) drug therapy: Secondary | ICD-10-CM

## 2023-03-19 DIAGNOSIS — R0689 Other abnormalities of breathing: Secondary | ICD-10-CM | POA: Diagnosis not present

## 2023-03-19 DIAGNOSIS — Z9911 Dependence on respirator [ventilator] status: Secondary | ICD-10-CM | POA: Diagnosis not present

## 2023-03-19 DIAGNOSIS — Z96642 Presence of left artificial hip joint: Secondary | ICD-10-CM | POA: Diagnosis present

## 2023-03-19 DIAGNOSIS — Z8249 Family history of ischemic heart disease and other diseases of the circulatory system: Secondary | ICD-10-CM | POA: Diagnosis not present

## 2023-03-19 DIAGNOSIS — F0393 Unspecified dementia, unspecified severity, with mood disturbance: Secondary | ICD-10-CM | POA: Diagnosis present

## 2023-03-19 DIAGNOSIS — Z4682 Encounter for fitting and adjustment of non-vascular catheter: Secondary | ICD-10-CM | POA: Diagnosis not present

## 2023-03-19 DIAGNOSIS — R9082 White matter disease, unspecified: Secondary | ICD-10-CM | POA: Diagnosis not present

## 2023-03-19 DIAGNOSIS — R739 Hyperglycemia, unspecified: Secondary | ICD-10-CM | POA: Diagnosis present

## 2023-03-19 DIAGNOSIS — Z7901 Long term (current) use of anticoagulants: Secondary | ICD-10-CM | POA: Diagnosis not present

## 2023-03-19 DIAGNOSIS — L89892 Pressure ulcer of other site, stage 2: Secondary | ICD-10-CM | POA: Diagnosis present

## 2023-03-19 DIAGNOSIS — R918 Other nonspecific abnormal finding of lung field: Secondary | ICD-10-CM | POA: Diagnosis not present

## 2023-03-19 DIAGNOSIS — J984 Other disorders of lung: Secondary | ICD-10-CM | POA: Diagnosis not present

## 2023-03-19 DIAGNOSIS — R231 Pallor: Secondary | ICD-10-CM | POA: Diagnosis not present

## 2023-03-19 DIAGNOSIS — R7303 Prediabetes: Secondary | ICD-10-CM | POA: Diagnosis present

## 2023-03-19 DIAGNOSIS — Z1152 Encounter for screening for COVID-19: Secondary | ICD-10-CM | POA: Diagnosis not present

## 2023-03-19 DIAGNOSIS — J9601 Acute respiratory failure with hypoxia: Principal | ICD-10-CM | POA: Diagnosis present

## 2023-03-19 DIAGNOSIS — I6782 Cerebral ischemia: Secondary | ICD-10-CM | POA: Diagnosis not present

## 2023-03-19 DIAGNOSIS — Z87891 Personal history of nicotine dependence: Secondary | ICD-10-CM | POA: Diagnosis not present

## 2023-03-19 DIAGNOSIS — Z515 Encounter for palliative care: Secondary | ICD-10-CM | POA: Diagnosis not present

## 2023-03-19 DIAGNOSIS — Z96612 Presence of left artificial shoulder joint: Secondary | ICD-10-CM | POA: Diagnosis not present

## 2023-03-19 HISTORY — DX: Unspecified dementia, unspecified severity, without behavioral disturbance, psychotic disturbance, mood disturbance, and anxiety: F03.90

## 2023-03-19 HISTORY — DX: Bipolar disorder, unspecified: F31.9

## 2023-03-19 LAB — BLOOD GAS, ARTERIAL
Acid-base deficit: 0.4 mmol/L (ref 0.0–2.0)
Acid-base deficit: 1.7 mmol/L (ref 0.0–2.0)
Bicarbonate: 24.1 mmol/L (ref 20.0–28.0)
Bicarbonate: 23 mmol/L (ref 20.0–28.0)
Drawn by: 30136
FIO2: 50 %
MECHVT: 540 mL
O2 Saturation: 98.5 %
O2 Saturation: 97 %
PEEP: 5 cmH2O
Patient temperature: 37.9
Patient temperature: 36.8
RATE: 20 resp/min
pCO2 arterial: 40 mmHg (ref 32–48)
pCO2 arterial: 38 mmHg (ref 32–48)
pH, Arterial: 7.4 (ref 7.35–7.45)
pH, Arterial: 7.39 (ref 7.35–7.45)
pO2, Arterial: 83 mmHg (ref 83–108)
pO2, Arterial: 75 mmHg — ABNORMAL LOW (ref 83–108)

## 2023-03-19 LAB — CBC WITH DIFFERENTIAL/PLATELET
Abs Immature Granulocytes: 0.08 10*3/uL — ABNORMAL HIGH (ref 0.00–0.07)
Basophils Absolute: 0 10*3/uL (ref 0.0–0.1)
Basophils Relative: 0 %
Eosinophils Absolute: 0.1 10*3/uL (ref 0.0–0.5)
Eosinophils Relative: 1 %
HCT: 29.8 % — ABNORMAL LOW (ref 39.0–52.0)
Hemoglobin: 8.7 g/dL — ABNORMAL LOW (ref 13.0–17.0)
Immature Granulocytes: 1 %
Lymphocytes Relative: 9 %
Lymphs Abs: 1 10*3/uL (ref 0.7–4.0)
MCH: 24.6 pg — ABNORMAL LOW (ref 26.0–34.0)
MCHC: 29.2 g/dL — ABNORMAL LOW (ref 30.0–36.0)
MCV: 84.4 fL (ref 80.0–100.0)
Monocytes Absolute: 0.5 10*3/uL (ref 0.1–1.0)
Monocytes Relative: 4 %
Neutro Abs: 10.1 10*3/uL — ABNORMAL HIGH (ref 1.7–7.7)
Neutrophils Relative %: 85 %
Platelets: 496 10*3/uL — ABNORMAL HIGH (ref 150–400)
RBC: 3.53 MIL/uL — ABNORMAL LOW (ref 4.22–5.81)
RDW: 17.8 % — ABNORMAL HIGH (ref 11.5–15.5)
WBC: 11.8 10*3/uL — ABNORMAL HIGH (ref 4.0–10.5)
nRBC: 0.3 % — ABNORMAL HIGH (ref 0.0–0.2)

## 2023-03-19 LAB — CBC
HCT: 31.4 % — ABNORMAL LOW (ref 39.0–52.0)
Hemoglobin: 9.2 g/dL — ABNORMAL LOW (ref 13.0–17.0)
MCH: 25.4 pg — ABNORMAL LOW (ref 26.0–34.0)
MCHC: 29.3 g/dL — ABNORMAL LOW (ref 30.0–36.0)
MCV: 86.7 fL (ref 80.0–100.0)
Platelets: 497 10*3/uL — ABNORMAL HIGH (ref 150–400)
RBC: 3.62 MIL/uL — ABNORMAL LOW (ref 4.22–5.81)
RDW: 17.8 % — ABNORMAL HIGH (ref 11.5–15.5)
WBC: 13.2 10*3/uL — ABNORMAL HIGH (ref 4.0–10.5)
nRBC: 0.2 % (ref 0.0–0.2)

## 2023-03-19 LAB — COMPREHENSIVE METABOLIC PANEL
ALT: 21 U/L (ref 0–44)
ALT: 22 U/L (ref 0–44)
AST: 48 U/L — ABNORMAL HIGH (ref 15–41)
AST: 44 U/L — ABNORMAL HIGH (ref 15–41)
Albumin: 1.9 g/dL — ABNORMAL LOW (ref 3.5–5.0)
Albumin: 1.7 g/dL — ABNORMAL LOW (ref 3.5–5.0)
Alkaline Phosphatase: 68 U/L (ref 38–126)
Alkaline Phosphatase: 66 U/L (ref 38–126)
Anion gap: 9 (ref 5–15)
Anion gap: 11 (ref 5–15)
BUN: 47 mg/dL — ABNORMAL HIGH (ref 8–23)
BUN: 46 mg/dL — ABNORMAL HIGH (ref 8–23)
CO2: 24 mmol/L (ref 22–32)
CO2: 23 mmol/L (ref 22–32)
Calcium: 9.4 mg/dL (ref 8.9–10.3)
Calcium: 9.2 mg/dL (ref 8.9–10.3)
Chloride: 115 mmol/L — ABNORMAL HIGH (ref 98–111)
Chloride: 113 mmol/L — ABNORMAL HIGH (ref 98–111)
Creatinine, Ser: 1.53 mg/dL — ABNORMAL HIGH (ref 0.61–1.24)
Creatinine, Ser: 1.38 mg/dL — ABNORMAL HIGH (ref 0.61–1.24)
GFR, Estimated: 45 mL/min — ABNORMAL LOW (ref >60)
GFR, Estimated: 51 mL/min — ABNORMAL LOW (ref >60)
Glucose, Bld: 119 mg/dL — ABNORMAL HIGH (ref 70–99)
Glucose, Bld: 126 mg/dL — ABNORMAL HIGH (ref 70–99)
Potassium: 4.5 mmol/L (ref 3.5–5.1)
Potassium: 4.8 mmol/L (ref 3.5–5.1)
Sodium: 148 mmol/L — ABNORMAL HIGH (ref 135–145)
Sodium: 147 mmol/L — ABNORMAL HIGH (ref 135–145)
Total Bilirubin: 0.9 mg/dL (ref 0.3–1.2)
Total Bilirubin: 0.7 mg/dL (ref 0.3–1.2)
Total Protein: 6.3 g/dL — ABNORMAL LOW (ref 6.5–8.1)
Total Protein: 6.3 g/dL — ABNORMAL LOW (ref 6.5–8.1)

## 2023-03-19 LAB — RESP PANEL BY RT-PCR (RSV, FLU A&B, COVID)  RVPGX2
Influenza A by PCR: NEGATIVE
Influenza B by PCR: NEGATIVE
Resp Syncytial Virus by PCR: NEGATIVE
SARS Coronavirus 2 by RT PCR: NEGATIVE

## 2023-03-19 LAB — PROTIME-INR
INR: 1.5 — ABNORMAL HIGH (ref 0.8–1.2)
Prothrombin Time: 18.7 seconds — ABNORMAL HIGH (ref 11.4–15.2)

## 2023-03-19 LAB — GLUCOSE, CAPILLARY
Glucose-Capillary: 141 mg/dL — ABNORMAL HIGH (ref 70–99)
Glucose-Capillary: 83 mg/dL (ref 70–99)
Glucose-Capillary: 87 mg/dL (ref 70–99)

## 2023-03-19 LAB — LACTIC ACID, PLASMA
Lactic Acid, Venous: 3.6 mmol/L (ref 0.5–1.9)
Lactic Acid, Venous: 4.3 mmol/L (ref 0.5–1.9)

## 2023-03-19 LAB — I-STAT CHEM 8, ED
BUN: 42 mg/dL — ABNORMAL HIGH (ref 8–23)
Calcium, Ion: 1.25 mmol/L (ref 1.15–1.40)
Chloride: 118 mmol/L — ABNORMAL HIGH (ref 98–111)
Creatinine, Ser: 1.5 mg/dL — ABNORMAL HIGH (ref 0.61–1.24)
Glucose, Bld: 114 mg/dL — ABNORMAL HIGH (ref 70–99)
HCT: 29 % — ABNORMAL LOW (ref 39.0–52.0)
Hemoglobin: 9.9 g/dL — ABNORMAL LOW (ref 13.0–17.0)
Potassium: 4.6 mmol/L (ref 3.5–5.1)
Sodium: 150 mmol/L — ABNORMAL HIGH (ref 135–145)
TCO2: 22 mmol/L (ref 22–32)

## 2023-03-19 LAB — PROCALCITONIN: Procalcitonin: 5.22 ng/mL

## 2023-03-19 LAB — CBG MONITORING, ED: Glucose-Capillary: 93 mg/dL (ref 70–99)

## 2023-03-19 LAB — PHOSPHORUS: Phosphorus: 4.7 mg/dL — ABNORMAL HIGH (ref 2.5–4.6)

## 2023-03-19 LAB — HEMOGLOBIN A1C
Hgb A1c MFr Bld: 5.8 % — ABNORMAL HIGH (ref 4.8–5.6)
Mean Plasma Glucose: 119.76 mg/dL

## 2023-03-19 LAB — MRSA NEXT GEN BY PCR, NASAL: MRSA by PCR Next Gen: NOT DETECTED

## 2023-03-19 LAB — MAGNESIUM: Magnesium: 2.3 mg/dL (ref 1.7–2.4)

## 2023-03-19 MED ORDER — PHENYLEPHRINE HCL-NACL 20-0.9 MG/250ML-% IV SOLN: 25.0000 ug/min | INTRAVENOUS | Status: DC

## 2023-03-19 MED ORDER — VANCOMYCIN HCL 750 MG/150ML IV SOLN
750.0000 mg | Freq: Once | INTRAVENOUS | Status: AC
  Administered 2023-03-19: 750 mg via INTRAVENOUS
  Filled 2023-03-19: qty 150

## 2023-03-19 MED ORDER — SODIUM CHLORIDE 0.9 % IV SOLN
2.0000 g | Freq: Two times a day (BID) | INTRAVENOUS | Status: DC
  Administered 2023-03-20 – 2023-03-21 (×3): 2 g via INTRAVENOUS
  Filled 2023-03-19 (×3): qty 12.5

## 2023-03-19 MED ORDER — LACTATED RINGERS IV SOLN: INTRAVENOUS | Status: DC

## 2023-03-19 MED ORDER — DEXTROSE-SODIUM CHLORIDE 5-0.45 % IV SOLN: INTRAVENOUS | Status: DC

## 2023-03-19 MED ORDER — POLYETHYLENE GLYCOL 3350 17 G PO PACK: 17.0000 g | PACK | Freq: Every day | ORAL | Status: DC | PRN

## 2023-03-19 MED ORDER — DEXMEDETOMIDINE HCL IN NACL 200 MCG/50ML IV SOLN
0.0000 ug/kg/h | INTRAVENOUS | Status: DC
  Administered 2023-03-19: 0.4 ug/kg/h via INTRAVENOUS
  Filled 2023-03-19: qty 50

## 2023-03-19 MED ORDER — CHLORHEXIDINE GLUCONATE CLOTH 2 % EX PADS
6.0000 | MEDICATED_PAD | Freq: Every day | CUTANEOUS | Status: DC
  Administered 2023-03-19: 6 via TOPICAL

## 2023-03-19 MED ORDER — SODIUM CHLORIDE 0.9 % IV SOLN
2.0000 g | Freq: Once | INTRAVENOUS | Status: AC
  Administered 2023-03-19: 2 g via INTRAVENOUS
  Filled 2023-03-19: qty 12.5

## 2023-03-19 MED ORDER — POLYETHYLENE GLYCOL 3350 17 G PO PACK: 17.0000 g | PACK | Freq: Every day | ORAL | Status: DC

## 2023-03-19 MED ORDER — LACTATED RINGERS IV BOLUS (SEPSIS)
500.0000 mL | Freq: Once | INTRAVENOUS | Status: AC
  Administered 2023-03-19: 500 mL via INTRAVENOUS

## 2023-03-19 MED ORDER — APIXABAN 2.5 MG PO TABS
2.5000 mg | ORAL_TABLET | Freq: Two times a day (BID) | ORAL | Status: DC
  Administered 2023-03-20: 2.5 mg via ORAL
  Filled 2023-03-19: qty 1

## 2023-03-19 MED ORDER — PHENYLEPHRINE HCL-NACL 20-0.9 MG/250ML-% IV SOLN
INTRAVENOUS | Status: AC
  Filled 2023-03-19: qty 250

## 2023-03-19 MED ORDER — LACTATED RINGERS IV SOLN: INTRAVENOUS | Status: AC

## 2023-03-19 MED ORDER — APIXABAN 2.5 MG PO TABS: 2.5000 mg | ORAL_TABLET | Freq: Every day | ORAL | Status: DC

## 2023-03-19 MED ORDER — ALBUTEROL SULFATE HFA 108 (90 BASE) MCG/ACT IN AERS: 2.0000 | INHALATION_SPRAY | RESPIRATORY_TRACT | Status: DC | PRN

## 2023-03-19 MED ORDER — ORAL CARE MOUTH RINSE: 15.0000 mL | OROMUCOSAL | Status: DC | PRN

## 2023-03-19 MED ORDER — DEXMEDETOMIDINE HCL IN NACL 400 MCG/100ML IV SOLN
0.0000 ug/kg/h | INTRAVENOUS | Status: DC
  Administered 2023-03-19 – 2023-03-20 (×4): 0.6 ug/kg/h via INTRAVENOUS
  Administered 2023-03-21: 0.7 ug/kg/h via INTRAVENOUS
  Filled 2023-03-19 (×5): qty 100

## 2023-03-19 MED ORDER — NOREPINEPHRINE 4 MG/250ML-% IV SOLN
2.0000 ug/min | INTRAVENOUS | Status: DC
  Administered 2023-03-19: 17 ug/min via INTRAVENOUS
  Administered 2023-03-19: 2 ug/min via INTRAVENOUS
  Administered 2023-03-20: 4 ug/min via INTRAVENOUS
  Administered 2023-03-20: 12 ug/min via INTRAVENOUS
  Administered 2023-03-20: 7 ug/min via INTRAVENOUS
  Filled 2023-03-19 (×5): qty 250

## 2023-03-19 MED ORDER — ROCURONIUM BROMIDE 10 MG/ML (PF) SYRINGE
PREFILLED_SYRINGE | INTRAVENOUS | Status: AC
  Administered 2023-03-19: 80 mg
  Filled 2023-03-19: qty 10

## 2023-03-19 MED ORDER — FAMOTIDINE 20 MG PO TABS
20.0000 mg | ORAL_TABLET | Freq: Two times a day (BID) | ORAL | Status: DC
  Administered 2023-03-20 (×2): 20 mg
  Filled 2023-03-19 (×2): qty 1

## 2023-03-19 MED ORDER — ETOMIDATE 2 MG/ML IV SOLN
INTRAVENOUS | Status: AC
  Administered 2023-03-19: 20 mg
  Filled 2023-03-19: qty 20

## 2023-03-19 MED ORDER — SODIUM CHLORIDE 0.9 % IV SOLN: 250.0000 mL | INTRAVENOUS | Status: DC

## 2023-03-19 MED ORDER — FENTANYL BOLUS VIA INFUSION
25.0000 ug | INTRAVENOUS | Status: DC | PRN
  Administered 2023-03-19 (×2): 50 ug via INTRAVENOUS
  Administered 2023-03-20 (×2): 25 ug via INTRAVENOUS
  Administered 2023-03-20: 50 ug via INTRAVENOUS
  Administered 2023-03-20: 25 ug via INTRAVENOUS
  Administered 2023-03-21: 50 ug via INTRAVENOUS
  Administered 2023-03-21: 100 ug via INTRAVENOUS

## 2023-03-19 MED ORDER — VANCOMYCIN HCL IN DEXTROSE 1-5 GM/200ML-% IV SOLN
1000.0000 mg | Freq: Once | INTRAVENOUS | Status: AC
  Administered 2023-03-19: 1000 mg via INTRAVENOUS
  Filled 2023-03-19: qty 200

## 2023-03-19 MED ORDER — VASOPRESSIN 20 UNITS/100 ML INFUSION FOR SHOCK
INTRAVENOUS | Status: AC
  Filled 2023-03-19: qty 100

## 2023-03-19 MED ORDER — INSULIN ASPART 100 UNIT/ML IJ SOLN
0.0000 [IU] | INTRAMUSCULAR | Status: DC
  Administered 2023-03-20 (×3): 1 [IU] via SUBCUTANEOUS

## 2023-03-19 MED ORDER — VALPROIC ACID 250 MG/5ML PO SOLN
250.0000 mg | Freq: Three times a day (TID) | ORAL | Status: DC
  Filled 2023-03-19 (×2): qty 5

## 2023-03-19 MED ORDER — PROPOFOL 1000 MG/100ML IV EMUL
0.0000 ug/kg/min | INTRAVENOUS | Status: DC
  Administered 2023-03-19: 5 ug/kg/min via INTRAVENOUS
  Filled 2023-03-19: qty 100

## 2023-03-19 MED ORDER — SODIUM CHLORIDE 0.9 % IV SOLN
250.0000 mL | INTRAVENOUS | Status: DC
  Administered 2023-03-19: 250 mL via INTRAVENOUS

## 2023-03-19 MED ORDER — LACTATED RINGERS IV BOLUS (SEPSIS)
1000.0000 mL | Freq: Once | INTRAVENOUS | Status: AC
  Administered 2023-03-19: 1000 mL via INTRAVENOUS

## 2023-03-19 MED ORDER — VANCOMYCIN HCL IN DEXTROSE 1-5 GM/200ML-% IV SOLN: 1000.0000 mg | INTRAVENOUS | Status: DC

## 2023-03-19 MED ORDER — VASOPRESSIN 20 UNITS/100 ML INFUSION FOR SHOCK: 0.0000 [IU]/min | INTRAVENOUS | Status: DC

## 2023-03-19 MED ORDER — ORAL CARE MOUTH RINSE
15.0000 mL | OROMUCOSAL | Status: DC
  Administered 2023-03-19 – 2023-03-21 (×15): 15 mL via OROMUCOSAL

## 2023-03-19 MED ORDER — DOCUSATE SODIUM 50 MG/5ML PO LIQD: 100.0000 mg | Freq: Two times a day (BID) | ORAL | Status: DC

## 2023-03-19 MED ORDER — FENTANYL 2500MCG IN NS 250ML (10MCG/ML) PREMIX INFUSION
25.0000 ug/h | INTRAVENOUS | Status: DC
  Administered 2023-03-19: 25 ug/h via INTRAVENOUS
  Administered 2023-03-21: 100 ug/h via INTRAVENOUS
  Filled 2023-03-19 (×2): qty 250

## 2023-03-19 MED ORDER — DOCUSATE SODIUM 100 MG PO CAPS: 100.0000 mg | ORAL_CAPSULE | Freq: Two times a day (BID) | ORAL | Status: DC | PRN

## 2023-03-19 MED ORDER — MEMANTINE HCL 5 MG PO TABS: 5.0000 mg | ORAL_TABLET | Freq: Two times a day (BID) | ORAL | Status: DC

## 2023-03-19 NOTE — Progress Notes (Signed)
Pt transported to CT and back to the ED room 2 without noted complications.

## 2023-03-19 NOTE — ED Triage Notes (Signed)
Pt arrived [facility] from [facility] for respiratory distress. Pt was 73% on 2 lpm upon [facility] arrival. Pt placed on CPAP with [facility]. Pt has been dx with pneumonia and started on Levaquin 750mg . Pt has had 2 doses per MAR. Pt received neb tx at [facility]. 20 G in RFA, 500 NS bolus given by [facility].

## 2023-03-19 NOTE — ED Notes (Signed)
EDP aware of BP dropping

## 2023-03-19 NOTE — Progress Notes (Signed)
I contacted his legal guardian, Malik Nicholson, with [facility].  I updated her about [name] current medical status.  We discussed the need for central venous catheter placement if aggressive therapy is to continue, but discussed that he has poor baseline functional status and is critically ill with grim prognosis for meaningful recovery.  As such transitioning to comfort measures would likely be more appropriate option.  I sent the below by secure email to [name]:   To [name]:  This is in reference to Malik Nicholson, DOB February 23, 1941.  He has been admitted to [facility] [place] with septic shock and acute hypoxic respiratory failure from pneumonia on 03/12/2023.  He is on mechanical ventilatory support, broad spectrum antibiotics, and now needing high dose intravenous medications to support his blood pressure.  It is normal practice in this situation to require central venous catheter placement.  The benefits of this procedure include, but are not limited to, more aggressive support of his blood pressure in the setting of septic shock and avoidance of tissue necrosis related to running high dose blood pressure supportive medications through smaller peripheral veins.  The risks of the procedure include, but are not limited to, bleeding, lung collapse, and blood stream infection.  Having said this, he has advanced dementia with poor functional status.  Given the severity of his pneumonia and septic shock, it is my medical opinion that he will be in a significantly more weakened state as a result of his current acute illness with a grim prognosis for meaningful recovery.  It is my medical opinion that transitioning to comfort care measures would be in his best interest.  Please do not hesitate to contact me if you have any questions.  Coralyn Helling, MD  Pulmonary and Critical Care Medicine [facility]  She will discuss with her supervise and then notify  the medical team about whether we should continue aggressive therapy with central venous catheter placement, or transition to comfort measures.  Coralyn Helling, MD [facility] Pulmonary/Critical Care Pager - [phone] or [phone] 03/03/2023, 8:30 PM

## 2023-03-19 NOTE — Progress Notes (Addendum)
eLink Physician-Brief Progress Note Patient Name: Malik Nicholson DOB: 09/05/1940 MRN: [id number]   Date of Service  03/09/2023  HPI/Events of Note  Severe hypotension. Patient haas already received 2 liters of IV fluids.  eICU Interventions  Levophed gtt increased to 20 mcg, Peripheral Phenylephrine gtt added, Vasopressin ordered, arterial line ordered, stat ABG ordered, Ground Crew requested to come and place a central line for vasopressor access.        Migdalia Dk 03/05/2023, 7:56 PM

## 2023-03-19 NOTE — Progress Notes (Signed)
Pharmacy Antibiotic Note  Malik Nicholson is a 82 y.o. male admitted on 03/07/2023 with  unknown source of infection .  Pharmacy has been consulted for vancomycin and cefepime dosing.  Plan: Vancomycin 1750 mg IV x 1 dose. Vancomycin 1000 mg IV every 24 hours. Cefepime 2000 mg IV every 12 hours. Monitor labs, c/s, and vanco levels as indicated.  Height: 5\' 8"  (172.7 cm) Weight: 76.7 kg (169 lb) IBW/kg (Calculated) : 68.4  Temp (24hrs), Avg:100.3 F (37.9 C), Min:100.3 F (37.9 C), Max:100.3 F (37.9 C)  Recent Labs  Lab 02/26/2023 1308 03/10/2023 1311  WBC 11.8*  --   CREATININE 1.53* 1.50*  LATICACIDVEN 3.6*  --     Estimated Creatinine Clearance: 36.1 mL/min (A) (by C-G formula based on SCr of 1.5 mg/dL (H)).    Allergies  Allergen Reactions   Lisinopril Swelling    Antimicrobials this admission: Vanco 9/26 >> Cefepime 9/26 >>    Microbiology results: 9/26 BCx: pending   Thank you for allowing pharmacy to be a part of this patient's care.  Judeth Cornfield, PharmD Clinical Pharmacist 03/07/2023 2:59 PM

## 2023-03-19 NOTE — ED Notes (Signed)
Pt. Has malewick on currently. Had not urinated since he's been here. Bladder scan showed 200 mL. Will continue malewick and monitor any output.

## 2023-03-19 NOTE — ED Provider Notes (Signed)
It will need trending.  Independent visualization and interpretation of imaging: - I independently visualized the following imaging with scope of interpretation limited to determining acute life threatening conditions related to emergency care: X-ray of the chest, which revealed right-sided pneumonia.  Treatment and Reassessment: Patient receiving IV fluids, his blood pressure has responded to the fluid.  MAP is staying about 60.  We will start him on nor epi since he has completed his fluid and the MAP remains less than 65.  Consultation: - Consulted or discussed management/test interpretation with external professional: Critical care, they will admit.  Final Clinical Impression(s) / ED Diagnoses Final diagnoses:  Acute respiratory failure with hypoxia [facility])    Rx / DC Orders ED Discharge Orders     None         Derwood Kaplan, MD 03/23/2023 1554  It will need trending.  Independent visualization and interpretation of imaging: - I independently visualized the following imaging with scope of interpretation limited to determining acute life threatening conditions related to emergency care: X-ray of the chest, which revealed right-sided pneumonia.  Treatment and Reassessment: Patient receiving IV fluids, his blood pressure has responded to the fluid.  MAP is staying about 60.  We will start him on nor epi since he has completed his fluid and the MAP remains less than 65.  Consultation: - Consulted or discussed management/test interpretation with external professional: Critical care, they will admit.  Final Clinical Impression(s) / ED Diagnoses Final diagnoses:  Acute respiratory failure with hypoxia [facility])    Rx / DC Orders ED Discharge Orders     None         Derwood Kaplan, MD 03/23/2023 1554  [place] EMERGENCY DEPARTMENT AT [facility] Provider Note   CSN: [id number] Arrival date & time: 03/09/2023  1226     History  Chief Complaint  Patient presents with   Respiratory Distress    Malik Nicholson is a 82 y.o. male.  HPI    82 year old male comes in with chief complaint of respiratory distress. Level 5 caveat for altered mental status.  Patient brought in from nursing home by EMS.  According to nursing home, patient has been being treated for pneumonia with Levaquin the last few days.  Today he was altered.  Normally patient is agitated.  Per EMS, when they arrived patient was hypoxic, saturating in the 70s on 2 L of oxygen.  Patient chronically on 2 L of oxygen.  He was also in respiratory distress and unresponsive.  I called [facility], who has guardianship of the patient.  They indicated that patient is full code.  They were made aware that patient was being sent to the ER.  Home Medications Prior to Admission medications   Medication Sig Start Date End Date Taking? Authorizing Provider  Amino Acids-Protein Hydrolys (PRO-STAT 64 PO) Take 30 mLs by mouth daily.   Yes [provider]  ascorbic acid (VITAMIN C) 500 MG tablet Take 500 mg by mouth 2 (two) times daily.   Yes [provider]  Cholecalciferol (VITAMIN D3) 50 MCG (2000 UT) capsule Take 2,000 Units by mouth daily.   Yes [provider]  divalproex (DEPAKOTE SPRINKLE) 125 MG capsule Take 250 mg by mouth 3 (three) times daily.   Yes [provider]  divalproex (DEPAKOTE) 250 MG DR tablet Take 250 mg by mouth 3 (three) times daily.   Yes [provider]  Docusate Sodium (DSS) 100 MG CAPS Take 1 capsule by mouth daily. 03/08/18  Yes [provider]  ELIQUIS 2.5 MG TABS tablet Take 2.5 mg by mouth daily. 02/13/23  Yes [provider]  guaiFENesin (MUCINEX) 600 MG 12 hr tablet Take 600 mg by mouth 2 (two) times daily.   Yes [provider]  levofloxacin (LEVAQUIN) 750 MG tablet Take 750 mg by mouth daily. 7 days supply 03/16/23  Yes [provider]  losartan (COZAAR) 25 MG tablet Take 25 mg by mouth daily.   Yes [provider]  melatonin 3 MG TABS tablet Take 3 mg by mouth at bedtime.   Yes [provider]  memantine (NAMENDA) 5 MG tablet Take 5 mg by mouth 2 (two) times daily.   Yes [provider]  Menthol-Zinc Oxide (CALMOSEPTINE) 0.44-20.6 % OINT Apply topically.   Yes [provider]  Multiple Vitamin (MULTIVITAMIN) tablet Take 1 tablet by mouth daily.   Yes [provider]  SANTYL 250 UNIT/GM ointment Apply topically. 03/07/23  Yes [provider]  traMADol HCl 100 MG TABS Take 1 tablet by mouth 2 (two) times daily. 03/16/23  Yes [provider]  zinc sulfate 220 (50 Zn) MG capsule Take 220 mg by mouth daily.   Yes [provider]  LORazepam (ATIVAN) 1 MG tablet     [provider]      Allergies    Lisinopril    Review of Systems   Review of Systems  All other systems reviewed and are negative.   Physical Exam Updated Vital Signs BP (!) 94/46   Pulse (!) 103   Temp 100.3 F (37.9 C) (Rectal)   Resp (!) 22   Ht 5\' 8"  (  [place] EMERGENCY DEPARTMENT AT [facility] Provider Note   CSN: [id number] Arrival date & time: 03/09/2023  1226     History  Chief Complaint  Patient presents with   Respiratory Distress    Malik Nicholson is a 82 y.o. male.  HPI    82 year old male comes in with chief complaint of respiratory distress. Level 5 caveat for altered mental status.  Patient brought in from nursing home by EMS.  According to nursing home, patient has been being treated for pneumonia with Levaquin the last few days.  Today he was altered.  Normally patient is agitated.  Per EMS, when they arrived patient was hypoxic, saturating in the 70s on 2 L of oxygen.  Patient chronically on 2 L of oxygen.  He was also in respiratory distress and unresponsive.  I called [facility], who has guardianship of the patient.  They indicated that patient is full code.  They were made aware that patient was being sent to the ER.  Home Medications Prior to Admission medications   Medication Sig Start Date End Date Taking? Authorizing Provider  Amino Acids-Protein Hydrolys (PRO-STAT 64 PO) Take 30 mLs by mouth daily.   Yes [provider]  ascorbic acid (VITAMIN C) 500 MG tablet Take 500 mg by mouth 2 (two) times daily.   Yes [provider]  Cholecalciferol (VITAMIN D3) 50 MCG (2000 UT) capsule Take 2,000 Units by mouth daily.   Yes [provider]  divalproex (DEPAKOTE SPRINKLE) 125 MG capsule Take 250 mg by mouth 3 (three) times daily.   Yes [provider]  divalproex (DEPAKOTE) 250 MG DR tablet Take 250 mg by mouth 3 (three) times daily.   Yes [provider]  Docusate Sodium (DSS) 100 MG CAPS Take 1 capsule by mouth daily. 03/08/18  Yes [provider]  ELIQUIS 2.5 MG TABS tablet Take 2.5 mg by mouth daily. 02/13/23  Yes [provider]  guaiFENesin (MUCINEX) 600 MG 12 hr tablet Take 600 mg by mouth 2 (two) times daily.   Yes [provider]  levofloxacin (LEVAQUIN) 750 MG tablet Take 750 mg by mouth daily. 7 days supply 03/16/23  Yes [provider]  losartan (COZAAR) 25 MG tablet Take 25 mg by mouth daily.   Yes [provider]  melatonin 3 MG TABS tablet Take 3 mg by mouth at bedtime.   Yes [provider]  memantine (NAMENDA) 5 MG tablet Take 5 mg by mouth 2 (two) times daily.   Yes [provider]  Menthol-Zinc Oxide (CALMOSEPTINE) 0.44-20.6 % OINT Apply topically.   Yes [provider]  Multiple Vitamin (MULTIVITAMIN) tablet Take 1 tablet by mouth daily.   Yes [provider]  SANTYL 250 UNIT/GM ointment Apply topically. 03/07/23  Yes [provider]  traMADol HCl 100 MG TABS Take 1 tablet by mouth 2 (two) times daily. 03/16/23  Yes [provider]  zinc sulfate 220 (50 Zn) MG capsule Take 220 mg by mouth daily.   Yes [provider]  LORazepam (ATIVAN) 1 MG tablet     [provider]      Allergies    Lisinopril    Review of Systems   Review of Systems  All other systems reviewed and are negative.   Physical Exam Updated Vital Signs BP (!) 94/46   Pulse (!) 103   Temp 100.3 F (37.9 C) (Rectal)   Resp (!) 22   Ht 5\' 8"  (

## 2023-03-19 NOTE — ED Notes (Signed)
EDP at bedside

## 2023-03-19 NOTE — Sepsis Progress Note (Signed)
eLink is following this Code Sepsis.

## 2023-03-19 NOTE — IPAL (Signed)
Interdisciplinary Goals of Care Family Meeting   Date carried out: 03/02/2023  Location of the meeting: Conference room  Member's involved: Physician, Bedside Registered Nurse, and Family Member or next of kin  Durable Power of Attorney or acting medical decision maker: Malik Nicholson, Legal Guardian  Discussion: We discussed goals of care for Malik Nicholson .  Meet with his wife and two other family members.  Discussed his pre-hospital condition and poor function status.  Explained the severity of his current illness with septic shock and respiratory failure from pneumonia.  The family is in agreement that he has suffered enough and would not want to put him through additional aggressive interventions.  This information was communicated to his guardian who is in agreement.  Code status:   Code Status: Do not attempt resuscitation (DNR) PRE-ARREST INTERVENTIONS DESIRED   Disposition: DNR, no escalation of care.  Continue current therapies for now.  Family plans to gather on 03/20/23 and then transition to comfort measures after the medical team consults again with his legal guardian.  Time spent for the meeting: 26 minutes    Coralyn Helling, MD  02/28/2023, 9:09 PM

## 2023-03-19 NOTE — H&P (Signed)
NAME:  Malik Nicholson, MRN:  [id number], DOB:  May 03, 1941, LOS: 0 ADMISSION DATE:  03/20/2023, CONSULTATION DATE: 9/26 REFERRING MD: Dr. Rhunette Croft, CHIEF COMPLAINT: Hypoxia  History of Present Illness:  82 year old male with limited past medical history available in the EMR.  Known history of hypertension, ETOH, dementia, DVT, hyperlipidemia, and bipolar disorder.  He is on 2 L of oxygen chronically.  He presented to [facility] emergency department on 9/26 from skilled nursing facility with complaints of respiratory distress and altered mental status.  Per report from the nursing facility the patient was recently diagnosed with pneumonia and had been treated with Levaquin for 3 days prior to presentation.  Upon EMS arrival the patient was noted to be saturating in the 70s on his chronic 2 L.  He was also unresponsive and having respiratory distress.  The patient was confirmed to be a full code by [facility] who is his legal guardian. BiPAP was used en route to ED. He remained unresponsive upon arrival to the ED and was intubated.  He was started on treatment for pneumonia and pressors for hypotension following intubation. Transferred to [facility] for ICU admission.   Pertinent  Medical History   has a past medical history of Arthritis, Bipolar 1 disorder (HCC), Dementia (HCC), High cholesterol, and Hypertension. Prior DVT, on Eliquis  Significant Hospital Events: Including procedures, antibiotic start and stop dates in addition to other pertinent events   9/26 intubated for hypoxia, AMS  Interim History / Subjective:   Arrived to ICU intubated, on propofol and Levophed drips  Objective   Blood pressure (!) 89/62, pulse 92, temperature 100.3 F (37.9 C), temperature source Rectal, resp. rate (!) 22, height 5\' 8"  (1.727 m), weight 76.7 kg, SpO2 96%.    Vent Mode: PRVC FiO2 (%):  [50 %] 50 % Set Rate:  [16 bmp] 16 bmp Vt Set:  [540 mL] 540 mL PEEP:  [5 cmH20] 5 cmH20 Plateau Pressure:  [19  cmH20-22 cmH20] 22 cmH20   Intake/Output Summary (Last 24 hours) at 03/16/2023 1746 Last data filed at 03/09/2023 1426 Gross per 24 hour  Intake 95.79 ml  Output --  Net 95.79 ml   Filed Weights   03/04/2023 1246  Weight: 76.7 kg    Examination: General: Elderly man, orally intubated, no distress HENT: Corneal opacity, no pallor or icterus Lungs: Bilateral ventilated breath sounds decreased on left Cardiovascular: S1 S2 regular Abdomen: Soft, nontender no organomegaly Extremities: Increased tone, no deformity Neuro: Eyes open, does not follow commands, slightly rigid extremities   Chest x-ray shows ET tube in position, left lower lobe consolidation  Labs show hypernatremia, BUN/creatinine 42/1.5, mild leukocytosis, anemia at given Lactate increased from 3.6-4.3  Resolved Hospital Problem list     Assessment & Plan:  Limited history available, he was being treated at the nursing home for pneumonia and arrived hypoxic and was emergently intubated in the ED followed by hypotension Less likely to be PE since he is already on Eliquis  Acute respiratory failure with hypoxia Left lower lobe pneumonia?  HAP versus aspiration  -Vent settings reviewed and adjusted -Empiric cefepime/vancomycin while we await respiratory culture -Tracheobronchial toilet -PAD protocol with intermittent fentanyl, prefer Precedex with goal RASS 0 to -1  Septic shock versus hypotension related to intubation in the setting of dehydration -Repeat lactate increasing -PIV Levophed -Fluids per sepsis protocol, then 100 cc an hour -Repeat lactate in 3 hours.  AKI versus prerenal -Hold losartan IV fluids  Dementia -continue  NAME:  Malik Nicholson, MRN:  [id number], DOB:  May 03, 1941, LOS: 0 ADMISSION DATE:  03/18/2023, CONSULTATION DATE: 9/26 REFERRING MD: Dr. Rhunette Croft, CHIEF COMPLAINT: Hypoxia  History of Present Illness:  82 year old male with limited past medical history available in the EMR.  Known history of hypertension, ETOH, dementia, DVT, hyperlipidemia, and bipolar disorder.  He is on 2 L of oxygen chronically.  He presented to [facility] emergency department on 9/26 from skilled nursing facility with complaints of respiratory distress and altered mental status.  Per report from the nursing facility the patient was recently diagnosed with pneumonia and had been treated with Levaquin for 3 days prior to presentation.  Upon EMS arrival the patient was noted to be saturating in the 70s on his chronic 2 L.  He was also unresponsive and having respiratory distress.  The patient was confirmed to be a full code by [facility] who is his legal guardian. BiPAP was used en route to ED. He remained unresponsive upon arrival to the ED and was intubated.  He was started on treatment for pneumonia and pressors for hypotension following intubation. Transferred to [facility] for ICU admission.   Pertinent  Medical History   has a past medical history of Arthritis, Bipolar 1 disorder (HCC), Dementia (HCC), High cholesterol, and Hypertension. Prior DVT, on Eliquis  Significant Hospital Events: Including procedures, antibiotic start and stop dates in addition to other pertinent events   9/26 intubated for hypoxia, AMS  Interim History / Subjective:   Arrived to ICU intubated, on propofol and Levophed drips  Objective   Blood pressure (!) 89/62, pulse 92, temperature 100.3 F (37.9 C), temperature source Rectal, resp. rate (!) 22, height 5\' 8"  (1.727 m), weight 76.7 kg, SpO2 96%.    Vent Mode: PRVC FiO2 (%):  [50 %] 50 % Set Rate:  [16 bmp] 16 bmp Vt Set:  [540 mL] 540 mL PEEP:  [5 cmH20] 5 cmH20 Plateau Pressure:  [19  cmH20-22 cmH20] 22 cmH20   Intake/Output Summary (Last 24 hours) at 03/10/2023 1746 Last data filed at 03/23/2023 1426 Gross per 24 hour  Intake 95.79 ml  Output --  Net 95.79 ml   Filed Weights   03/16/2023 1246  Weight: 76.7 kg    Examination: General: Elderly man, orally intubated, no distress HENT: Corneal opacity, no pallor or icterus Lungs: Bilateral ventilated breath sounds decreased on left Cardiovascular: S1 S2 regular Abdomen: Soft, nontender no organomegaly Extremities: Increased tone, no deformity Neuro: Eyes open, does not follow commands, slightly rigid extremities   Chest x-ray shows ET tube in position, left lower lobe consolidation  Labs show hypernatremia, BUN/creatinine 42/1.5, mild leukocytosis, anemia at given Lactate increased from 3.6-4.3  Resolved Hospital Problem list     Assessment & Plan:  Limited history available, he was being treated at the nursing home for pneumonia and arrived hypoxic and was emergently intubated in the ED followed by hypotension Less likely to be PE since he is already on Eliquis  Acute respiratory failure with hypoxia Left lower lobe pneumonia?  HAP versus aspiration  -Vent settings reviewed and adjusted -Empiric cefepime/vancomycin while we await respiratory culture -Tracheobronchial toilet -PAD protocol with intermittent fentanyl, prefer Precedex with goal RASS 0 to -1  Septic shock versus hypotension related to intubation in the setting of dehydration -Repeat lactate increasing -PIV Levophed -Fluids per sepsis protocol, then 100 cc an hour -Repeat lactate in 3 hours.  AKI versus prerenal -Hold losartan IV fluids  Dementia -continue  NAME:  Malik Nicholson, MRN:  [id number], DOB:  May 03, 1941, LOS: 0 ADMISSION DATE:  03/20/2023, CONSULTATION DATE: 9/26 REFERRING MD: Dr. Rhunette Croft, CHIEF COMPLAINT: Hypoxia  History of Present Illness:  82 year old male with limited past medical history available in the EMR.  Known history of hypertension, ETOH, dementia, DVT, hyperlipidemia, and bipolar disorder.  He is on 2 L of oxygen chronically.  He presented to [facility] emergency department on 9/26 from skilled nursing facility with complaints of respiratory distress and altered mental status.  Per report from the nursing facility the patient was recently diagnosed with pneumonia and had been treated with Levaquin for 3 days prior to presentation.  Upon EMS arrival the patient was noted to be saturating in the 70s on his chronic 2 L.  He was also unresponsive and having respiratory distress.  The patient was confirmed to be a full code by [facility] who is his legal guardian. BiPAP was used en route to ED. He remained unresponsive upon arrival to the ED and was intubated.  He was started on treatment for pneumonia and pressors for hypotension following intubation. Transferred to [facility] for ICU admission.   Pertinent  Medical History   has a past medical history of Arthritis, Bipolar 1 disorder (HCC), Dementia (HCC), High cholesterol, and Hypertension. Prior DVT, on Eliquis  Significant Hospital Events: Including procedures, antibiotic start and stop dates in addition to other pertinent events   9/26 intubated for hypoxia, AMS  Interim History / Subjective:   Arrived to ICU intubated, on propofol and Levophed drips  Objective   Blood pressure (!) 89/62, pulse 92, temperature 100.3 F (37.9 C), temperature source Rectal, resp. rate (!) 22, height 5\' 8"  (1.727 m), weight 76.7 kg, SpO2 96%.    Vent Mode: PRVC FiO2 (%):  [50 %] 50 % Set Rate:  [16 bmp] 16 bmp Vt Set:  [540 mL] 540 mL PEEP:  [5 cmH20] 5 cmH20 Plateau Pressure:  [19  cmH20-22 cmH20] 22 cmH20   Intake/Output Summary (Last 24 hours) at 03/16/2023 1746 Last data filed at 03/09/2023 1426 Gross per 24 hour  Intake 95.79 ml  Output --  Net 95.79 ml   Filed Weights   03/04/2023 1246  Weight: 76.7 kg    Examination: General: Elderly man, orally intubated, no distress HENT: Corneal opacity, no pallor or icterus Lungs: Bilateral ventilated breath sounds decreased on left Cardiovascular: S1 S2 regular Abdomen: Soft, nontender no organomegaly Extremities: Increased tone, no deformity Neuro: Eyes open, does not follow commands, slightly rigid extremities   Chest x-ray shows ET tube in position, left lower lobe consolidation  Labs show hypernatremia, BUN/creatinine 42/1.5, mild leukocytosis, anemia at given Lactate increased from 3.6-4.3  Resolved Hospital Problem list     Assessment & Plan:  Limited history available, he was being treated at the nursing home for pneumonia and arrived hypoxic and was emergently intubated in the ED followed by hypotension Less likely to be PE since he is already on Eliquis  Acute respiratory failure with hypoxia Left lower lobe pneumonia?  HAP versus aspiration  -Vent settings reviewed and adjusted -Empiric cefepime/vancomycin while we await respiratory culture -Tracheobronchial toilet -PAD protocol with intermittent fentanyl, prefer Precedex with goal RASS 0 to -1  Septic shock versus hypotension related to intubation in the setting of dehydration -Repeat lactate increasing -PIV Levophed -Fluids per sepsis protocol, then 100 cc an hour -Repeat lactate in 3 hours.  AKI versus prerenal -Hold losartan IV fluids  Dementia -continue

## 2023-03-19 NOTE — ED Notes (Signed)
1306 20mg  Etomidate 1306 80mg  Rocuronium  1306 intubation 7.5 cm ET tube 24 cm at lip 1307 color change  OG 18 fr 64cm at teeth (still waiting for xray confirmation)

## 2023-03-20 ENCOUNTER — Inpatient Hospital Stay (HOSPITAL_COMMUNITY): Payer: Medicare HMO

## 2023-03-20 ENCOUNTER — Encounter (HOSPITAL_COMMUNITY): Payer: Self-pay | Admitting: Pulmonary Disease

## 2023-03-20 DIAGNOSIS — J9601 Acute respiratory failure with hypoxia: Secondary | ICD-10-CM | POA: Diagnosis not present

## 2023-03-20 DIAGNOSIS — Z9911 Dependence on respirator [ventilator] status: Secondary | ICD-10-CM

## 2023-03-20 DIAGNOSIS — J189 Pneumonia, unspecified organism: Secondary | ICD-10-CM | POA: Diagnosis not present

## 2023-03-20 DIAGNOSIS — A419 Sepsis, unspecified organism: Secondary | ICD-10-CM | POA: Diagnosis not present

## 2023-03-20 LAB — CBC
HCT: 31.8 % — ABNORMAL LOW (ref 39.0–52.0)
Hemoglobin: 9.4 g/dL — ABNORMAL LOW (ref 13.0–17.0)
MCH: 25.6 pg — ABNORMAL LOW (ref 26.0–34.0)
MCHC: 29.6 g/dL — ABNORMAL LOW (ref 30.0–36.0)
MCV: 86.6 fL (ref 80.0–100.0)
Platelets: 450 10*3/uL — ABNORMAL HIGH (ref 150–400)
RBC: 3.67 MIL/uL — ABNORMAL LOW (ref 4.22–5.81)
RDW: 17.7 % — ABNORMAL HIGH (ref 11.5–15.5)
WBC: 16.4 10*3/uL — ABNORMAL HIGH (ref 4.0–10.5)
nRBC: 0.2 % (ref 0.0–0.2)

## 2023-03-20 LAB — GLUCOSE, CAPILLARY
Glucose-Capillary: 142 mg/dL — ABNORMAL HIGH (ref 70–99)
Glucose-Capillary: 141 mg/dL — ABNORMAL HIGH (ref 70–99)

## 2023-03-20 LAB — MAGNESIUM: Magnesium: 2.1 mg/dL (ref 1.7–2.4)

## 2023-03-20 LAB — LACTIC ACID, PLASMA: Lactic Acid, Venous: 5 mmol/L (ref 0.5–1.9)

## 2023-03-20 LAB — BASIC METABOLIC PANEL
Anion gap: 12 (ref 5–15)
BUN: 42 mg/dL — ABNORMAL HIGH (ref 8–23)
CO2: 23 mmol/L (ref 22–32)
Calcium: 9.3 mg/dL (ref 8.9–10.3)
Chloride: 112 mmol/L — ABNORMAL HIGH (ref 98–111)
Creatinine, Ser: 1.02 mg/dL (ref 0.61–1.24)
GFR, Estimated: >60 mL/min (ref >60)
Glucose, Bld: 158 mg/dL — ABNORMAL HIGH (ref 70–99)
Potassium: 4.5 mmol/L (ref 3.5–5.1)
Sodium: 147 mmol/L — ABNORMAL HIGH (ref 135–145)

## 2023-03-20 LAB — PHOSPHORUS: Phosphorus: 3.9 mg/dL (ref 2.5–4.6)

## 2023-03-20 MED ORDER — APIXABAN 2.5 MG PO TABS
2.5000 mg | ORAL_TABLET | Freq: Two times a day (BID) | ORAL | Status: DC
  Administered 2023-03-20: 2.5 mg
  Filled 2023-03-20: qty 1

## 2023-03-20 MED ORDER — LACTATED RINGERS IV BOLUS
500.0000 mL | Freq: Once | INTRAVENOUS | Status: AC
  Administered 2023-03-20: 500 mL via INTRAVENOUS

## 2023-03-20 MED ORDER — SODIUM CHLORIDE 0.9 % IV SOLN
100.0000 mg | Freq: Two times a day (BID) | INTRAVENOUS | Status: DC
  Administered 2023-03-20 – 2023-03-21 (×3): 100 mg via INTRAVENOUS
  Filled 2023-03-20 (×4): qty 100

## 2023-03-20 NOTE — Progress Notes (Signed)
NAME:  Malik Nicholson, MRN:  [id number], DOB:  1941-04-08, LOS: 1 ADMISSION DATE:  2023/03/20, CONSULTATION DATE: 03/20/2023 REFERRING MD: Dr. Rhunette Croft, CHIEF COMPLAINT: Hypoxia  History of Present Illness:  82 year old male with limited past medical history available in the EMR.  Known history of hypertension, ETOH, dementia, DVT, hyperlipidemia, and bipolar disorder.  He is on 2 L of oxygen chronically.  He presented to [facility] emergency department on 2023/03/20 from skilled nursing facility with complaints of respiratory distress and altered mental status.  Per report from the nursing facility the patient was recently diagnosed with pneumonia and had been treated with Levaquin for 3 days prior to presentation.  Upon EMS arrival the patient was noted to be saturating in the 70s on his chronic 2 L.  He was also unresponsive and having respiratory distress.  The patient was confirmed to be a full code by [facility] who is his legal guardian. BiPAP was used en route to ED. He remained unresponsive upon arrival to the ED and was intubated.  He was started on treatment for pneumonia and pressors for hypotension following intubation. Transferred to [facility] for ICU admission.   Pertinent  Medical History   has a past medical history of Arthritis, Bipolar 1 disorder (HCC), Dementia (HCC), High cholesterol, and Hypertension. Prior DVT, on Eliquis  Significant Hospital Events: Including procedures, antibiotic start and stop dates in addition to other pertinent events   03/20/23 intubated for hypoxia, AMS  Interim History / Subjective:  Family meeting overnight.   Objective   Blood pressure 138/63, pulse 61, temperature 97.9 F (36.6 C), temperature source Axillary, resp. rate 20, height 5\' 8"  (1.727 m), weight 74 kg, SpO2 100%.    Vent Mode: PRVC FiO2 (%):  [50 %] 50 % Set Rate:  [16 bmp-20 bmp] 20 bmp Vt Set:  [540 mL] 540 mL PEEP:  [5 cmH20] 5 cmH20 Plateau Pressure:  [14 cmH20-22 cmH20] 14 cmH20    Intake/Output Summary (Last 24 hours) at 03/20/2023 0716 Last data filed at 03/20/2023 8416 Gross per 24 hour  Intake 4472.93 ml  Output 450 ml  Net 4022.93 ml   Filed Weights   Mar 20, 2023 1246 03/20/23 0349  Weight: 76.7 kg 74 kg    Examination: General: chronically ill appearing man lying in bed intubated, lightly sedated HENT: NA/AT, ETT in place Lungs: CTAB, synchronous with MV, minimal ETT secretions Cardiovascular: S1S2, RRR Abdomen: soft, NT, ND Extremities: minimal muscle mass, mild edema Neuro: + cough, resists movement. RASS -5  Na+  147 BUN 42 Cr 1.02 LA 5.0 WBC 16.4 H/H 9.4/31.8 CXR personally reviewed> LLL infiltrate silhouetting left hemidiaphragm Blood cultures: pending  Resolved Hospital Problem list     Assessment & Plan:   Limited history available, he was being treated at the nursing home for pneumonia and arrived hypoxic and was emergently intubated in the ED followed by hypotension   Acute respiratory failure with hypoxia- suspected to be progressive LLL pneumonia, which he was being treated for PTA Less likely to be PE since he is already on Eliquis -LTVV; goal Pplat <30 and DP<15> currently meeting goals with [place] in mid-teens.  -con't empiric vanc & cefepime. Adding doxy for atypical coverage. -VAP prevention protocol -PAD protocol for sedation -daily SAT & SBT> failed today due to apnea per RT -will discuss ongoing goals of care with family when they arrive; based on overnight discussions they are tentatively planning on terminal extubation today.   Septic shock versus hypotension  related to intubation with dehydration; unfortunately lactic acid still rising -peripheral NE to maintain MAP >65 -con't IVF -serial lactates if continuing aggressive care  AKI, likely was pre-renal -hold ARB -volume resuscitated -maintain adequate perfusion  Dementia  -hold PTA namenda and depakote  H/o DVT -on low dose prophylactic  aipxaban  Pressure ulcers present on admission -left shin stage II and right lateral foot stage II -wound care -frequent turns  Hyperglycemia; prediabetes -SSI PRN -d/c d5-0.45 NS  Best Practice (right click and "Reselect all SmartList Selections" daily)   Diet/type: NPO DVT prophylaxis: DOAC GI prophylaxis: H2B Lines: N/A Foley:  N/A Code Status:  full code Last date of multidisciplinary goals of care discussion 9/26 overnight with POA & family  Labs   CBC: Recent Labs  Lab 03/17/2023 1308 03/08/2023 1311 03/05/2023 1942 03/20/23 0301  WBC 11.8*  --  13.2* 16.4*  NEUTROABS 10.1*  --   --   --   HGB 8.7* 9.9* 9.2* 9.4*  HCT 29.8* 29.0* 31.4* 31.8*  MCV 84.4  --  86.7 86.6  PLT 496*  --  497* 450*    Basic Metabolic Panel: Recent Labs  Lab 03/13/2023 1308 03/15/2023 1311 02/22/2023 1942 03/20/23 0301  NA 148* 150* 147* 147*  K 4.5 4.6 4.8 4.5  CL 115* 118* 113* 112*  CO2 24  --  23 23  GLUCOSE 119* 114* 126* 158*  BUN 47* 42* 46* 42*  CREATININE 1.53* 1.50* 1.38* 1.02  CALCIUM 9.4  --  9.2 9.3  MG  --   --  2.3 2.1  PHOS  --   --  4.7* 3.9   GFR: Estimated Creatinine Clearance: 53.1 mL/min (by C-G formula based on SCr of 1.02 mg/dL). Recent Labs  Lab 02/26/2023 1308 03/08/2023 1649 03/14/2023 1942 03/20/23 0301  PROCALCITON  --   --  5.22  --   WBC 11.8*  --  13.2* 16.4*  LATICACIDVEN 3.6* 4.3*  --  5.0*      Critical care time:      This patient is critically ill with multiple organ system failure which requires frequent high complexity decision making, assessment, support, evaluation, and titration of therapies. This was completed through the application of advanced monitoring technologies and extensive interpretation of multiple databases. During this encounter critical care time was devoted to patient care services described in this note for 40 minutes.  Steffanie Dunn, DO 03/20/23 8:28 AM [facility]  For contact information, see  Amion. If no response to pager, please call PCCM consult pager. After hours, 7PM- 7AM, please call Elink.

## 2023-03-20 NOTE — Care Management (Signed)
Transition of Care [facility]) - Inpatient Brief Assessment   Patient Details  Name: Malik Nicholson MRN: [id number] Date of Birth: 1940/06/25  Transition of Care [facility]) CM/SW Contact:    Lavenia Atlas, RN Phone Number: 03/20/2023, 6:37 PM   Clinical Narrative:  Per chart review patient currently intubated [facility] ICU. Plan for comfort care with life support withdrawal tomorrow.   No current TOC needs  Transition of Care (TOC) Department has reviewed patient and no TOC needs have been identified at this time. We will continue to monitor patient advancement through Interdisciplinary progressions and if new patient needs arise, please place a consult.   Transition of Care Asessment: Insurance and Status: Insurance coverage has been reviewed Patient has primary care physician: Yes Home environment has been reviewed: from LTC SNF/Memory care- [facility] Prior level of function:: SNF/Memory care resident Prior/Current Home Services: No current home services Social Determinants of Health Reivew: SDOH reviewed no interventions necessary Readmission risk has been reviewed: Yes Transition of care needs: no transition of care needs at this time

## 2023-03-20 NOTE — Plan of Care (Signed)
PT made comfort care. Plan is to withdraw vent tomorrow morning with family present. Life support medications have slowly been decreased throughout the day.

## 2023-03-20 NOTE — Progress Notes (Signed)
eLink Physician-Brief Progress Note Patient Name: Malik Nicholson DOB: [birth year] MRN: [id number]   Date of Service  03/20/2023  HPI/Events of Note  Lactic acid rising (now 5).  eICU Interventions  LR 500 ml IV bolus x 1 ordered, echocardiogram this AM to assess LV function.        Thomasene Lot Jaryah Aracena 03/20/2023, 5:25 AM

## 2023-03-20 NOTE — IPAL (Signed)
  Interdisciplinary Goals of Care Family Meeting   Date carried out: 03/20/2023  Location of the meeting: Bedside  Member's involved: Physician and Family Member or next of kin  Durable Power of Attorney or Environmental health practitioner: wife    Discussion: We discussed goals of care for Bayard Hugger .  I met with Mrs. Faulkenberry and another family member/ friend at bedside this morning. They understand that he is very ill and on life support. His wife understands his poor prognosis and does not want to prolong his life on life support. At the encouragement of her friend, she will allow family to come visit and wishes to withdraw aggressive life support at 10 tomorrow morning. Until then liberalized visitation to allow family to see him. She would like the Chaplain to see him as well- RN to call.   Code status:   Code Status: Do not attempt resuscitation (DNR) - Comfort care   Disposition: In-patient comfort care  Time spent for the meeting: 10 min.    Steffanie Dunn, DO  03/20/2023, 11:05 AM

## 2023-03-20 NOTE — Progress Notes (Signed)
Legal Guardian   [place] [employer]  [phone] (Cell)

## 2023-03-20 NOTE — Progress Notes (Signed)
Nurse notified by charge that [facility] lab called to say that patients blood cultures grew gram + cocci. Results relayed to providers overseeing care.

## 2023-03-20 NOTE — Progress Notes (Signed)
Chaplain Gertie Exon will come before 10:00 tomorrow and extubation.  If family needs additional support before that time, or if the timing changes for tomorrow, please page [place] at [phone]

## 2023-03-20 NOTE — Plan of Care (Signed)
  Problem: Safety: Goal: Ability to remain free from injury will improve Outcome: Progressing   Problem: Education: Goal: Knowledge of General Education information will improve Description: Including pain rating scale, medication(s)/side effects and non-pharmacologic comfort measures Outcome: Not Progressing   Problem: Health Behavior/Discharge Planning: Goal: Ability to manage health-related needs will improve Outcome: Not Progressing   Problem: Clinical Measurements: Goal: Ability to maintain clinical measurements within normal limits will improve Outcome: Not Progressing Goal: Will remain free from infection Outcome: Not Progressing Goal: Diagnostic test results will improve Outcome: Not Progressing Goal: Respiratory complications will improve Outcome: Not Progressing Goal: Cardiovascular complication will be avoided Outcome: Not Progressing   Problem: Activity: Goal: Risk for activity intolerance will decrease Outcome: Not Progressing   Problem: Nutrition: Goal: Adequate nutrition will be maintained Outcome: Not Progressing   Problem: Coping: Goal: Level of anxiety will decrease Outcome: Not Progressing   Problem: Elimination: Goal: Will not experience complications related to bowel motility Outcome: Not Progressing Goal: Will not experience complications related to urinary retention Outcome: Not Progressing

## 2023-03-21 DIAGNOSIS — J9601 Acute respiratory failure with hypoxia: Secondary | ICD-10-CM | POA: Diagnosis not present

## 2023-03-21 LAB — BLOOD CULTURE ID PANEL (REFLEXED) - BCID2

## 2023-03-21 LAB — BLOOD GAS, ARTERIAL

## 2023-03-21 MED ORDER — GLYCOPYRROLATE 1 MG PO TABS: 1.0000 mg | ORAL_TABLET | ORAL | Status: DC | PRN

## 2023-03-21 MED ORDER — MORPHINE 100MG IN NS 100ML (1MG/ML) PREMIX INFUSION
0.0000 mg/h | INTRAVENOUS | Status: DC
  Administered 2023-03-21: 9 mg/h via INTRAVENOUS
  Administered 2023-03-21: 5 mg/h via INTRAVENOUS
  Administered 2023-03-22 (×2): 10 mg/h via INTRAVENOUS
  Filled 2023-03-21 (×4): qty 100

## 2023-03-21 MED ORDER — MIDAZOLAM-SODIUM CHLORIDE 100-0.9 MG/100ML-% IV SOLN
0.0000 mg/h | INTRAVENOUS | Status: DC
  Administered 2023-03-21: 1 mg/h via INTRAVENOUS
  Administered 2023-03-22: 3 mg/h via INTRAVENOUS
  Filled 2023-03-21 (×2): qty 100

## 2023-03-21 MED ORDER — MORPHINE BOLUS VIA INFUSION
5.0000 mg | INTRAVENOUS | Status: DC | PRN
  Administered 2023-03-21 (×5): 5 mg via INTRAVENOUS

## 2023-03-21 MED ORDER — ACETAMINOPHEN 650 MG RE SUPP: 650.0000 mg | Freq: Four times a day (QID) | RECTAL | Status: DC | PRN

## 2023-03-21 MED ORDER — ORAL CARE MOUTH RINSE: 15.0000 mL | OROMUCOSAL | Status: DC | PRN

## 2023-03-21 MED ORDER — GLYCOPYRROLATE 0.2 MG/ML IJ SOLN: 0.2000 mg | INTRAMUSCULAR | Status: DC | PRN

## 2023-03-21 MED ORDER — MIDAZOLAM HCL 2 MG/2ML IJ SOLN
1.0000 mg | INTRAMUSCULAR | Status: DC | PRN
  Administered 2023-03-21: 1 mg via INTRAVENOUS

## 2023-03-21 MED ORDER — ACETAMINOPHEN 160 MG/5ML PO SOLN: 650.0000 mg | Freq: Four times a day (QID) | ORAL | Status: DC | PRN

## 2023-03-21 MED ORDER — SODIUM CHLORIDE 0.9 % IV SOLN: INTRAVENOUS | Status: DC

## 2023-03-21 MED ORDER — MIDAZOLAM BOLUS VIA INFUSION (WITHDRAWAL LIFE SUSTAINING TX)
2.0000 mg | INTRAVENOUS | Status: DC | PRN
  Administered 2023-03-21 (×2): 2 mg via INTRAVENOUS

## 2023-03-21 MED ORDER — GLYCOPYRROLATE 0.2 MG/ML IJ SOLN
0.2000 mg | INTRAMUSCULAR | Status: DC | PRN
  Administered 2023-03-21: 0.2 mg via INTRAVENOUS
  Filled 2023-03-21: qty 1

## 2023-03-21 MED ORDER — POLYVINYL ALCOHOL 1.4 % OP SOLN: 1.0000 [drp] | Freq: Four times a day (QID) | OPHTHALMIC | Status: DC | PRN

## 2023-03-21 NOTE — Progress Notes (Signed)
Pt extubated to 2L nasal cannula per  md order

## 2023-03-21 NOTE — Plan of Care (Signed)
  Problem: Activity: Goal: Ability to tolerate increased activity will improve Outcome: Progressing   Problem: Respiratory: Goal: Ability to maintain a clear airway and adequate ventilation will improve Outcome: Progressing   Problem: Role Relationship: Goal: Method of communication will improve Outcome: Progressing   

## 2023-03-21 NOTE — Progress Notes (Signed)
Spoke with patient's spouse and sisters at bedside  We have agreed on comfort measures  Orders placed for comfort measures only

## 2023-03-21 NOTE — Plan of Care (Signed)
Pt end of life/ comfort care.

## 2023-03-21 NOTE — Progress Notes (Signed)
Chaplain rec'd word from Chaplain Dyanne Carrel that the family of the pt would welcome prayer prior to extubation. Chaplain confirmed this request with RN.   Chaplain met with pt's spouse and SILs after MD and RN discussed plan for extubation with them. Family was receptive to a chaplain visit. Chaplain introduced self and role. Chaplain provided a compassionate space utilizing reflective listening. Pt and spouse have been married for 63 years. They have a faith background in [facility] Christianity. Family agreed to prayer.  Following prayer chaplain indicated continued spiritual care presence should they need it. Spouse is supported by her faith and her family during this time.     03/21/23 1033  Spiritual Encounters  Type of Visit Initial  Care provided to: Family  Referral source Chaplain team (Chaplain Dyanne Carrel)  Reason for visit End-of-life  OnCall Visit Yes  Spiritual Framework  Presenting Themes Rituals and practive;Community and relationships  Community/Connection Family  Strengths Spouse Malik Nicholson is being supported by her sisters  Family Stress Factors Loss  Interventions  Spiritual Care Interventions Made Prayer;Compassionate presence  Intervention Outcomes  Outcomes Connection to spiritual care  Spiritual Care Plan  Spiritual Care Issues Still Outstanding No further spiritual care needs at this time (see row info)

## 2023-03-21 NOTE — Progress Notes (Signed)
PHARMACY - PHYSICIAN COMMUNICATION CRITICAL VALUE ALERT - BLOOD CULTURE IDENTIFICATION (BCID)  SABURO LUGER is an 82 y.o. male who presented to [facility] on 03/03/2023 with acute respiratory failure with hypoxia.  Assessment:   BCID + Staph species (no organism identified) in 2/4 bottles. Plan of care is for withdrawal of aggressive life support at 10AM today.  Name of physician (or Provider) Contacted: Dr Warrick Parisian  Current antibiotics: Cefepime, Doxycycline (pneumonia)  Changes to prescribed antibiotics recommended:  No changes at this time  Results for orders placed or performed during the hospital encounter of 02/23/2023  Blood Culture ID Panel (Reflexed) (Collected: 03/12/2023  1:08 PM)  Result Value Ref Range   Enterococcus faecalis NOT DETECTED NOT DETECTED   Enterococcus Faecium NOT DETECTED NOT DETECTED   Listeria monocytogenes NOT DETECTED NOT DETECTED   Staphylococcus species DETECTED (A) NOT DETECTED   Staphylococcus aureus (BCID) NOT DETECTED NOT DETECTED   Staphylococcus epidermidis NOT DETECTED NOT DETECTED   Staphylococcus lugdunensis NOT DETECTED NOT DETECTED   Streptococcus species NOT DETECTED NOT DETECTED   Streptococcus agalactiae NOT DETECTED NOT DETECTED   Streptococcus pneumoniae NOT DETECTED NOT DETECTED   Streptococcus pyogenes NOT DETECTED NOT DETECTED   A.calcoaceticus-baumannii NOT DETECTED NOT DETECTED   Bacteroides fragilis NOT DETECTED NOT DETECTED   Enterobacterales NOT DETECTED NOT DETECTED   Enterobacter cloacae complex NOT DETECTED NOT DETECTED   Escherichia coli NOT DETECTED NOT DETECTED   Klebsiella aerogenes NOT DETECTED NOT DETECTED   Klebsiella oxytoca NOT DETECTED NOT DETECTED   Klebsiella pneumoniae NOT DETECTED NOT DETECTED   Proteus species NOT DETECTED NOT DETECTED   Salmonella species NOT DETECTED NOT DETECTED   Serratia marcescens NOT DETECTED NOT DETECTED   Haemophilus influenzae NOT DETECTED NOT DETECTED   Neisseria meningitidis  NOT DETECTED NOT DETECTED   Pseudomonas aeruginosa NOT DETECTED NOT DETECTED   Stenotrophomonas maltophilia NOT DETECTED NOT DETECTED   Candida albicans NOT DETECTED NOT DETECTED   Candida auris NOT DETECTED NOT DETECTED   Candida glabrata NOT DETECTED NOT DETECTED   Candida krusei NOT DETECTED NOT DETECTED   Candida parapsilosis NOT DETECTED NOT DETECTED   Candida tropicalis NOT DETECTED NOT DETECTED   Cryptococcus neoformans/gattii NOT DETECTED NOT DETECTED    Maryellen Pile, PharmD 03/21/2023  12:32 AM

## 2023-03-21 NOTE — Progress Notes (Addendum)
NAME:  Malik Nicholson, MRN:  [id number], DOB:  07-07-1940, LOS: 2 ADMISSION DATE:  03/21/2023, CONSULTATION DATE: 9/26 REFERRING MD: Dr. Rhunette Croft, CHIEF COMPLAINT: Hypoxia  History of Present Illness:  82 year old male with limited past medical history available in the EMR.  Known history of hypertension, ETOH, dementia, DVT, hyperlipidemia, and bipolar disorder.  He is on 2 L of oxygen chronically.  He presented to [facility] emergency department on 9/26 from skilled nursing facility with complaints of respiratory distress and altered mental status.  Per report from the nursing facility the patient was recently diagnosed with pneumonia and had been treated with Levaquin for 3 days prior to presentation.  Upon EMS arrival the patient was noted to be saturating in the 70s on his chronic 2 L.  He was also unresponsive and having respiratory distress.  The patient was confirmed to be a full code by [facility] who is his legal guardian. BiPAP was used en route to ED. He remained unresponsive upon arrival to the ED and was intubated.  He was started on treatment for pneumonia and pressors for hypotension following intubation. Transferred to [facility] for ICU admission.   Pertinent  Medical History   has a past medical history of Arthritis, Bipolar 1 disorder (HCC), Dementia (HCC), High cholesterol, and Hypertension. Prior DVT, on Eliquis  Significant Hospital Events: Including procedures, antibiotic start and stop dates in addition to other pertinent events   9/26 intubated for hypoxia, AMS  Interim History / Subjective:  Family meeting 9/27 Plan is to transition to comfort measures at about 10 AM today when family comes around  Objective   Blood pressure (!) 109/59, pulse (!) 55, temperature 97.6 F (36.4 C), temperature source Axillary, resp. rate 20, height 5\' 8"  (1.727 m), weight 74 kg, SpO2 99%.    Vent Mode: PRVC FiO2 (%):  [30 %-50 %] 30 % Set Rate:  [20 bmp] 20 bmp Vt Set:  [540 mL] 540  mL PEEP:  [5 cmH20] 5 cmH20 Plateau Pressure:  [15 cmH20-16 cmH20] 15 cmH20   Intake/Output Summary (Last 24 hours) at 03/21/2023 0729 Last data filed at 03/21/2023 7829 Gross per 24 hour  Intake 2269.5 ml  Output 785 ml  Net 1484.5 ml   Filed Weights   03/02/2023 1246 03/20/23 0349 03/21/23 0500  Weight: 76.7 kg 74 kg 74 kg    Examination: General: Chronically ill-appearing, mildly sedated because of agitation HENT: Tracheal tube in place, moist oral mucosa Lungs: Clear breath sounds auscultation Cardiovascular: S1-S2 appreciated, Abdomen: Soft, bowel sounds appreciated Extremities: Mild peripheral edema Neuro: Very minimal response to sternal rub  I reviewed nursing notes,  hospitalist notes, last 24 h vitals and pain scores, last 48 h intake and output, last 24 h labs and trends, and last 24 h imaging results.  Resolved Hospital Problem list     Assessment & Plan:   Extubation and transition to comfort measures today at 10 AM Updated patient's legal guardian Ms. Sherlyn Lick about plan of care to transition to comfort measures, she is in agreement with the family's wishes  Acute respiratory failure with hypoxia Left lower lobe pneumonia-treatment was started prior to admission History of PE for which he is on Eliquis -Continue mechanical ventilation -Target TVol 6-8cc/kgIBW -Target Plateau Pressure < 30cm H20 -Target driving pressure less than 15 cm of water -Target PaO2 55-65: titrate PEEP/FiO2 per protocol -Ventilator associated pneumonia prevention protocol  Septic shock Hypotension -Norepinephrine to maintain MAP greater than 65  AKI -Maintain renal perfusion -Volume resuscitated -Antihypertensives on hold  Dementia -Meds on hold  History of DVT -On apixaban  Pressure ulcers present on admission -left shin stage II and right lateral foot stage II -wound care -frequent turns  Hyperglycemia; prediabetes -SSI PRN -d/c d5-0.45 NS  Best Practice  (right click and "Reselect all SmartList Selections" daily)   Diet/type: NPO DVT prophylaxis: DOAC GI prophylaxis: H2B Lines: N/A Foley:  N/A Code Status:  full code Last date of multidisciplinary goals of care discussion 2023/03/20 overnight with POA & family  Labs   CBC: Recent Labs  Lab 03-20-2023 1308 03-20-23 1311 03-20-23 1942 03/20/23 0301  WBC 11.8*  --  13.2* 16.4*  NEUTROABS 10.1*  --   --   --   HGB 8.7* 9.9* 9.2* 9.4*  HCT 29.8* 29.0* 31.4* 31.8*  MCV 84.4  --  86.7 86.6  PLT 496*  --  497* 450*    Basic Metabolic Panel: Recent Labs  Lab Mar 20, 2023 1308 March 20, 2023 1311 Mar 20, 2023 1942 03/20/23 0301  NA 148* 150* 147* 147*  K 4.5 4.6 4.8 4.5  CL 115* 118* 113* 112*  CO2 24  --  23 23  GLUCOSE 119* 114* 126* 158*  BUN 47* 42* 46* 42*  CREATININE 1.53* 1.50* 1.38* 1.02  CALCIUM 9.4  --  9.2 9.3  MG  --   --  2.3 2.1  PHOS  --   --  4.7* 3.9   GFR: Estimated Creatinine Clearance: 53.1 mL/min (by C-G formula based on SCr of 1.02 mg/dL). Recent Labs  Lab 03/20/2023 1308 03-20-23 1649 03-20-23 1942 03/20/23 0301  PROCALCITON  --   --  5.22  --   WBC 11.8*  --  13.2* 16.4*  LATICACIDVEN 3.6* 4.3*  --  5.0*   The patient is critically ill with multiple organ systems failure and requires high complexity decision making for assessment and support, frequent evaluation and titration of therapies, application of advanced monitoring technologies and extensive interpretation of multiple databases. Critical Care Time devoted to patient care services described in this note independent of APP/resident time (if applicable)  is 30 minutes.   Virl Diamond MD [facility] Personal pager: See Amion If unanswered, please page CCM On-call: #[phone]

## 2023-03-22 DIAGNOSIS — J9601 Acute respiratory failure with hypoxia: Secondary | ICD-10-CM | POA: Diagnosis not present

## 2023-03-23 LAB — CULTURE, BLOOD (ROUTINE X 2): Special Requests: ADEQUATE

## 2023-03-24 NOTE — Death Summary Note (Signed)
DEATH SUMMARY   Patient Details  Name: Malik Nicholson MRN: [id number] DOB: [birth year]  Admission/Discharge Information   Admit Date:  Mar 27, 2023  Date of Death: Date of Death: 03/30/2023  Time of Death: Time of Death: 1417/05/20  Length of Stay: 3  Referring Physician: Pcp, No   Reason(s) for Hospitalization  Admitted to the hospital with shortness of breath, altered mental status, recent treatment for pneumonia  Diagnoses  Preliminary cause of death:  Secondary Diagnoses (including complications and co-morbidities):  Principal Problem:   Acute hypoxemic respiratory failure (HCC) Active Problems:   Acute on chronic respiratory failure with hypoxia (HCC)   Acute respiratory failure with hypoxia (HCC) Acute renal failure Septic shock Dementia Prediabetes Acute on chronic respiratory failure  Brief Hospital Course (including significant findings, care, treatment, and services provided and events leading to death)  ANTWOINE MCGUFFIE is a 82 y.o. year old male who was brought into the hospital for altered mental status, shortness of breath.  Had recently been treated for pneumonia Initial evaluation did reveal hypoxemia.  Was started on BiPAP, started on antibiotics.  Required pressors for septic shock.  Decompensated to requiring to be on the ventilator on 926 Ongoing family meetings about his chronic health problems including history of hypertension, dementia, bipolar disorder, recent resident of skilled nursing facility with declining health recently. While continuing antibiotics and aggressive support decision was made to transition to DNR status and further in further discussion with family decision was made to transition to comfort measures only  Patient was extubated to comfort on 03/21/2023 and succumbed to his illness on Mar 30, 2023 at 1418 hrs.  Cause of death is pneumonia complicated by acute on chronic respiratory failure, septic shock, acute kidney injury    Pertinent Labs  and Studies  Significant Diagnostic Studies DG Abd 1 View  Result Date: 03/20/2023 CLINICAL DATA:  Orogastric tube placement. EXAM: ABDOMEN - 1 VIEW COMPARISON:  None Available. FINDINGS: Distal tip of feeding tube is seen in expected position of proximal stomach. IMPRESSION: Distal tip of feeding tube is seen in expected position of proximal stomach. Electronically Signed   By: Lupita Raider M.D.   On: 03/20/2023 12:15   DG Chest Port 1 View  Result Date: 03/20/2023 CLINICAL DATA:  Pneumonia EXAM: PORTABLE CHEST 1 VIEW COMPARISON:  Chest radiograph 1 day prior FINDINGS: The endotracheal tube tip is proximally 3.4 cm from the carina. The enteric catheter tip is in the stomach. The cardiomediastinal silhouette is stable. Aeration of the lungs is not significantly changed, with unchanged patchy opacities in the left lower lobe. The calcified granuloma in the right upper lobe is unchanged. There is no new or worsening focal airspace disease. There is no pulmonary edema. There is no pleural effusion or pneumothorax There is no acute osseous abnormality. IMPRESSION: Unchanged patchy opacities in the left lower lobe. Electronically Signed   By: Lesia Hausen M.D.   On: 03/20/2023 08:19   CT Head Wo Contrast  Result Date: 2023/03/27 CLINICAL DATA:  Ages.  Ventilator dependence. EXAM: CT HEAD WITHOUT CONTRAST TECHNIQUE: Contiguous axial images were obtained from the base of the skull through the vertex without intravenous contrast. RADIATION DOSE REDUCTION: This exam was performed according to the departmental dose-optimization program which includes automated exposure control, adjustment of the mA and/or kV according to patient size and/or use of iterative reconstruction technique. COMPARISON:  11/03/2013 FINDINGS: Brain: There is no evidence for acute hemorrhage, hydrocephalus, mass lesion, or abnormal extra-axial fluid collection. No definite CT  evidence for acute infarction. Diffuse loss of parenchymal  volume is consistent with atrophy. Patchy low attenuation in the deep hemispheric and periventricular white matter is nonspecific, but likely reflects chronic microvascular ischemic demyelination. Global ventriculomegaly is probably related to central atrophy although hydrocephalus not excluded. Patchy low attenuation in the deep hemispheric and periventricular white matter is nonspecific, but likely reflects chronic microvascular ischemic demyelination. Vascular: No hyperdense vessel or unexpected calcification. Skull: No evidence for fracture. No worrisome lytic or sclerotic lesion. Sinuses/Orbits: Chronic mucosal disease noted both maxillary sinuses. Visualized portions of the globes and intraorbital fat are unremarkable. Other: None. IMPRESSION: 1. No evidence for an acute hemorrhage. No findings to suggest acute ischemia. 2. Global ventriculomegaly is probably related to central atrophy although hydrocephalus not excluded. 3. Chronic maxillary sinusitis. 4. Atrophy with chronic small vessel white matter ischemic disease. Electronically Signed   By: Kennith Center M.D.   On: 03/31/23 16:01   DG Chest Port 1 View  Result Date: 03/31/2023 CLINICAL DATA:  Questionable sepsis.  Ventilator dependence EXAM: PORTABLE CHEST 1 VIEW COMPARISON:  12/27/2021 FINDINGS: Endotracheal tube tip is approximately 5.2 cm above the base of the carina NG tube tip is in the gastric fundus. The cardiopericardial silhouette is within normal limits for size. Interstitial markings are diffusely coarsened with chronic features. Calcified granuloma in the right upper lobe again noted. Patchy airspace disease medial left base suggests pneumonia. Telemetry leads overlie the chest. Status post left shoulder replacement. IMPRESSION: 1. Patchy airspace disease medial left base suggests pneumonia. 2. Chronic interstitial coarsening. Electronically Signed   By: Kennith Center M.D.   On: 03-31-2023 15:58    Microbiology Recent Results (from  the past 240 hour(s))  Culture, blood (Routine x 2)     Status: None (Preliminary result)   Collection Time: 03/31/23  1:08 PM   Specimen: BLOOD  Result Value Ref Range Status   Specimen Description   Final    BLOOD BLOOD RIGHT ARM Performed at [facility], [address], [place], [place] [address]    Special Requests   Final    BOTTLES DRAWN AEROBIC AND ANAEROBIC Blood Culture adequate volume Performed at [facility], [address], [place], [place] [address]    Culture  Setup Time   Final    GRAM POSITIVE COCCI AEROBIC BOTTLE ONLY Gram Stain Report Called to,Read Back By and Verified With: CRAPP @ 1613 ON [id number] BY HENDERSON L CRITICAL RESULT CALLED TO, READ BACK BY AND VERIFIED WITH: L POINDEXTER,PHARMD@0010  03/21/23 MK    Culture   Final    GRAM POSITIVE COCCI IDENTIFICATION AND SUSCEPTIBILITIES TO FOLLOW Performed at [facility], [address] [place], [place] [address]    Report Status PENDING  Incomplete  Blood Culture ID Panel (Reflexed)     Status: Abnormal   Collection Time: 03-31-23  1:08 PM  Result Value Ref Range Status   Enterococcus faecalis NOT DETECTED NOT DETECTED Final   Enterococcus Faecium NOT DETECTED NOT DETECTED Final   Listeria monocytogenes NOT DETECTED NOT DETECTED Final   Staphylococcus species DETECTED (A) NOT DETECTED Final    Comment: CRITICAL RESULT CALLED TO, READ BACK BY AND VERIFIED WITH: L POINDEXTER,PHARMD@0010  03/21/23 MK    Staphylococcus aureus (BCID) NOT DETECTED NOT DETECTED Final   Staphylococcus epidermidis NOT DETECTED NOT DETECTED Final   Staphylococcus lugdunensis NOT DETECTED NOT DETECTED Final   Streptococcus species NOT DETECTED NOT DETECTED Final   Streptococcus agalactiae NOT DETECTED NOT DETECTED Final   Streptococcus  pneumoniae NOT DETECTED NOT DETECTED Final   Streptococcus pyogenes NOT DETECTED NOT DETECTED Final   A.calcoaceticus-baumannii NOT DETECTED NOT DETECTED Final   Bacteroides fragilis NOT DETECTED  NOT DETECTED Final   Enterobacterales NOT DETECTED NOT DETECTED Final   Enterobacter cloacae complex NOT DETECTED NOT DETECTED Final   Escherichia coli NOT DETECTED NOT DETECTED Final   Klebsiella aerogenes NOT DETECTED NOT DETECTED Final   Klebsiella oxytoca NOT DETECTED NOT DETECTED Final   Klebsiella pneumoniae NOT DETECTED NOT DETECTED Final   Proteus species NOT DETECTED NOT DETECTED Final   Salmonella species NOT DETECTED NOT DETECTED Final   Serratia marcescens NOT DETECTED NOT DETECTED Final   Haemophilus influenzae NOT DETECTED NOT DETECTED Final   Neisseria meningitidis NOT DETECTED NOT DETECTED Final   Pseudomonas aeruginosa NOT DETECTED NOT DETECTED Final   Stenotrophomonas maltophilia NOT DETECTED NOT DETECTED Final   Candida albicans NOT DETECTED NOT DETECTED Final   Candida auris NOT DETECTED NOT DETECTED Final   Candida glabrata NOT DETECTED NOT DETECTED Final   Candida krusei NOT DETECTED NOT DETECTED Final   Candida parapsilosis NOT DETECTED NOT DETECTED Final   Candida tropicalis NOT DETECTED NOT DETECTED Final   Cryptococcus neoformans/gattii NOT DETECTED NOT DETECTED Final    Comment: Performed at [facility], [address] [place], [place] [address]  Resp panel by RT-PCR (RSV, Flu A&B, Covid) Anterior Nasal Swab     Status: None   Collection Time: 03/09/2023  1:25 PM   Specimen: Anterior Nasal Swab  Result Value Ref Range Status   SARS Coronavirus 2 by RT PCR NEGATIVE NEGATIVE Final    Comment: (NOTE) SARS-CoV-2 target nucleic acids are NOT DETECTED.  The SARS-CoV-2 RNA is generally detectable in upper respiratory specimens during the acute phase of infection. The lowest concentration of SARS-CoV-2 viral copies this assay can detect is 138 copies/mL. A negative result does not preclude SARS-Cov-2 infection and should not be used as the sole basis for treatment or other patient management decisions. A negative result may occur with  improper specimen  collection/handling, submission of specimen other than nasopharyngeal swab, presence of viral mutation(s) within the areas targeted by this assay, and inadequate number of viral copies(<138 copies/mL). A negative result must be combined with clinical observations, patient history, and epidemiological information. The expected result is Negative.  Fact Sheet for Patients:  BloggerCourse.com  Fact Sheet for Healthcare Providers:  SeriousBroker.it  This test is no t yet approved or cleared by the [place] FDA and  has been authorized for detection and/or diagnosis of SARS-CoV-2 by FDA under an Emergency Use Authorization (EUA). This EUA will remain  in effect (meaning this test can be used) for the duration of the COVID-19 declaration under Section 564(b)(1) of the Act, 21 U.S.C.section 360bbb-3(b)(1), unless the authorization is terminated  or revoked sooner.       Influenza A by PCR NEGATIVE NEGATIVE Final   Influenza B by PCR NEGATIVE NEGATIVE Final    Comment: (NOTE) The Xpert Xpress SARS-CoV-2/FLU/RSV plus assay is intended as an aid in the diagnosis of influenza from Nasopharyngeal swab specimens and should not be used as a sole basis for treatment. Nasal washings and aspirates are unacceptable for Xpert Xpress SARS-CoV-2/FLU/RSV testing.  Fact Sheet for Patients: BloggerCourse.com  Fact Sheet for Healthcare Providers: SeriousBroker.it  This test is not yet approved or cleared by the [place] FDA and has been authorized for detection and/or diagnosis of SARS-CoV-2 by FDA under an Emergency Use  Authorization (EUA). This EUA will remain in effect (meaning this test can be used) for the duration of the COVID-19 declaration under Section 564(b)(1) of the Act, 21 U.S.C. section 360bbb-3(b)(1), unless the authorization is terminated or revoked.     Resp Syncytial  Virus by PCR NEGATIVE NEGATIVE Final    Comment: (NOTE) Fact Sheet for Patients: BloggerCourse.com  Fact Sheet for Healthcare Providers: SeriousBroker.it  This test is not yet approved or cleared by the [place] FDA and has been authorized for detection and/or diagnosis of SARS-CoV-2 by FDA under an Emergency Use Authorization (EUA). This EUA will remain in effect (meaning this test can be used) for the duration of the COVID-19 declaration under Section 564(b)(1) of the Act, 21 U.S.C. section 360bbb-3(b)(1), unless the authorization is terminated or revoked.  Performed at [facility], [address], [place], [place] [address]   Culture, blood (Routine x 2)     Status: None (Preliminary result)   Collection Time: 03/01/2023  1:38 PM   Specimen: BLOOD  Result Value Ref Range Status   Specimen Description   Final    BLOOD RFOA Performed at [facility], [address], [place], [place] [address]    Special Requests   Final    BOTTLES DRAWN AEROBIC AND ANAEROBIC Blood Culture results may not be optimal due to an excessive volume of blood received in culture bottles Performed at [facility], [address] [place], [place] [address]    Culture  Setup Time   Final    AEROBIC BOTTLE ONLY GRAM POSITIVE COCCI Gram Stain Report Called to,Read Back By and Verified With:  LISA SCHMIDT 2007 03/20/23 NN Performed at [facility], [address], [place], [place] [address]    Culture   Final    GRAM POSITIVE COCCI IDENTIFICATION TO FOLLOW Performed at [facility], [address] [place], [place] [address]    Report Status PENDING  Incomplete  MRSA Next Gen by PCR, Nasal     Status: None   Collection Time: 03/10/2023  7:17 PM   Specimen: Nasal Mucosa; Nasal Swab  Result Value Ref Range Status   MRSA by PCR Next Gen NOT DETECTED NOT DETECTED Final    Comment: (NOTE) The GeneXpert MRSA Assay (FDA approved for NASAL specimens  only), is one component of a comprehensive MRSA colonization surveillance program. It is not intended to diagnose MRSA infection nor to guide or monitor treatment for MRSA infections. Test performance is not FDA approved in patients less than 32 years old. Performed at [facility], [address] [place], [place] [address]     Lab Basic Metabolic Panel: Recent Labs  Lab 03/13/2023 1308 03/23/2023 1311 03/20/2023 1942 03/20/23 0301  NA 148* 150* 147* 147*  K 4.5 4.6 4.8 4.5  CL 115* 118* 113* 112*  CO2 24  --  23 23  GLUCOSE 119* 114* 126* 158*  BUN 47* 42* 46* 42*  CREATININE 1.53* 1.50* 1.38* 1.02  CALCIUM 9.4  --  9.2 9.3  MG  --   --  2.3 2.1  PHOS  --   --  4.7* 3.9   Liver Function Tests: Recent Labs  Lab 03/04/2023 1308 02/24/2023 1942  AST 48* 44*  ALT 21 22  ALKPHOS 68 66  BILITOT 0.9 0.7  PROT 6.3* 6.3*  ALBUMIN 1.9* 1.7*   No results for input(s): "LIPASE", "AMYLASE" in the last 168 hours. No results for input(s): "AMMONIA" in the last 168 hours. CBC:  Recent Labs  Lab 03/10/2023 1308 02/24/2023 1311 03/03/2023 1942 03/20/23 0301  WBC 11.8*  --  13.2* 16.4*  NEUTROABS 10.1*  --   --   --   HGB 8.7* 9.9* 9.2* 9.4*  HCT 29.8* 29.0* 31.4* 31.8*  MCV 84.4  --  86.7 86.6  PLT 496*  --  497* 450*   Cardiac Enzymes: No results for input(s): "CKTOTAL", "CKMB", "CKMBINDEX", "TROPONINI" in the last 168 hours. Sepsis Labs: Recent Labs  Lab 03/04/2023 1308 02/27/2023 1649 03/17/2023 1942 03/20/23 0301  PROCALCITON  --   --  5.22  --   WBC 11.8*  --  13.2* 16.4*  LATICACIDVEN 3.6* 4.3*  --  5.0*    Procedures/Operations     Harlei Lehrmann A Aloura Matsuoka 25-Mar-2023, 3:17 PM

## 2023-03-24 NOTE — Progress Notes (Signed)
NAME:  Malik Nicholson, MRN:  [id number], DOB:  1940-09-30, LOS: 3 ADMISSION DATE:  03/07/2023, CONSULTATION DATE: 9/26 REFERRING MD: Dr. Rhunette Croft, Malik COMPLAINT: Hypoxia  History of Present Illness:  82 year old male with limited past medical history available in the EMR.  Known history of hypertension, ETOH, dementia, DVT, hyperlipidemia, and bipolar disorder.  He is on 2 L of oxygen chronically.  He presented to [facility] emergency department on 9/26 from skilled nursing facility with complaints of respiratory distress and altered mental status.  Per report from the nursing facility the patient was recently diagnosed with pneumonia and had been treated with Levaquin for 3 days prior to presentation.  Upon EMS arrival the patient was noted to be saturating in the 70s on his chronic 2 L.  He was also unresponsive and having respiratory distress.  The patient was confirmed to be a full code by [facility] who is his legal guardian. BiPAP was used en route to ED. He remained unresponsive upon arrival to the ED and was intubated.  He was started on treatment for pneumonia and pressors for hypotension following intubation. Transferred to [facility] for ICU admission.   Pertinent  Medical History   has a past medical history of Arthritis, Bipolar 1 disorder (HCC), Dementia (HCC), High cholesterol, and Hypertension. Prior DVT, on Eliquis  Significant Hospital Events: Including procedures, antibiotic start and stop dates in addition to other pertinent events   9/26 intubated for hypoxia, AMS 9/28-transition to comfort measures only  Interim History / Subjective:  Family meeting 9/27 Transition to comfort measures only after about 10 AM on 9/28 He does remain comfortable  Objective   Blood pressure (!) 96/55, pulse 77, temperature 97.6 F (36.4 C), temperature source Axillary, resp. rate (!) 6, height 5\' 8"  (1.727 m), weight 74 kg, SpO2 (!) 84%.    Vent Mode: PRVC FiO2 (%):  [30 %] 30 % Set  Rate:  [20 bmp] 20 bmp Vt Set:  [540 mL] 540 mL PEEP:  [5 cmH20] 5 cmH20 Plateau Pressure:  [19 cmH20] 19 cmH20   Intake/Output Summary (Last 24 hours) at 28-Mar-2023 0739 Last data filed at 03-28-2023 0542 Gross per 24 hour  Intake 687.51 ml  Output 200 ml  Net 487.51 ml   Filed Weights   03/20/23 0349 03/21/23 0500 March 28, 2023 0500  Weight: 74 kg 74 kg 74 kg    Examination: Elderly gentleman, does not appear to be in distress Comfortable Spouse at bedside   Resolved Hospital Problem list     Assessment & Plan:   Extubated and transition to comfort measures at about 10:00 03/21/2023 Remains on morphine and midazolam drip  Was treated for septic shock AKI Background history of dementia History of DVT History of prediabetes  At present patient remains comfortable All focuses on maintaining current state Spouse's questions answered at bedside  Virl Diamond, MD [place] Pager: See Loretha Stapler

## 2023-03-24 NOTE — Plan of Care (Signed)

## 2023-03-24 DEATH — deceased
